# Patient Record
Sex: Female | Born: 1960
Health system: Southern US, Community
[De-identification: ages and names within clinical notes are randomized; demographics above are authoritative.]

## PROBLEM LIST (undated history)

## (undated) DIAGNOSIS — E785 Hyperlipidemia, unspecified: Secondary | ICD-10-CM

## (undated) DIAGNOSIS — M1990 Unspecified osteoarthritis, unspecified site: Secondary | ICD-10-CM

## (undated) DIAGNOSIS — K5909 Other constipation: Secondary | ICD-10-CM

## (undated) DIAGNOSIS — F329 Major depressive disorder, single episode, unspecified: Secondary | ICD-10-CM

## (undated) DIAGNOSIS — K449 Diaphragmatic hernia without obstruction or gangrene: Secondary | ICD-10-CM

## (undated) DIAGNOSIS — N201 Calculus of ureter: Secondary | ICD-10-CM

## (undated) DIAGNOSIS — F32A Depression, unspecified: Secondary | ICD-10-CM

## (undated) DIAGNOSIS — E119 Type 2 diabetes mellitus without complications: Secondary | ICD-10-CM

## (undated) HISTORY — PX: CARPAL TUNNEL RELEASE: SHX101

## (undated) HISTORY — PX: RETINAL DETACHMENT SURGERY: SHX105

## (undated) HISTORY — PX: ANTERIOR FUSION CERVICAL SPINE: SUR626

---

## 1998-06-27 ENCOUNTER — Encounter: Admission: RE | Admit: 1998-06-27 | Discharge: 1998-09-25 | Payer: Self-pay | Admitting: Internal Medicine

## 2003-09-20 ENCOUNTER — Emergency Department (HOSPITAL_COMMUNITY): Admission: EM | Admit: 2003-09-20 | Discharge: 2003-09-21 | Payer: Self-pay | Admitting: Emergency Medicine

## 2010-01-22 ENCOUNTER — Encounter: Payer: Self-pay | Admitting: Internal Medicine

## 2010-05-08 ENCOUNTER — Emergency Department (HOSPITAL_BASED_OUTPATIENT_CLINIC_OR_DEPARTMENT_OTHER)
Admission: EM | Admit: 2010-05-08 | Discharge: 2010-05-08 | Disposition: A | Discharge status: Home / Self Care | Payer: Private Health Insurance - Indemnity | Attending: Emergency Medicine | Admitting: Emergency Medicine

## 2010-05-08 DIAGNOSIS — Z1833 Retained wood fragments: Secondary | ICD-10-CM | POA: Insufficient documentation

## 2010-05-08 DIAGNOSIS — M795 Residual foreign body in soft tissue: Secondary | ICD-10-CM | POA: Insufficient documentation

## 2010-05-08 DIAGNOSIS — E7800 Pure hypercholesterolemia, unspecified: Secondary | ICD-10-CM | POA: Insufficient documentation

## 2010-05-08 DIAGNOSIS — E119 Type 2 diabetes mellitus without complications: Secondary | ICD-10-CM | POA: Insufficient documentation

## 2010-05-08 DIAGNOSIS — I10 Essential (primary) hypertension: Secondary | ICD-10-CM | POA: Insufficient documentation

## 2010-05-08 DIAGNOSIS — Z79899 Other long term (current) drug therapy: Secondary | ICD-10-CM | POA: Insufficient documentation

## 2012-03-12 ENCOUNTER — Ambulatory Visit
Admission: RE | Admit: 2012-03-12 | Discharge: 2012-03-12 | Disposition: A | Discharge status: Home / Self Care | Payer: Private Health Insurance - Indemnity | Source: Ambulatory Visit | Attending: Family Medicine | Admitting: Family Medicine

## 2012-03-12 ENCOUNTER — Other Ambulatory Visit: Payer: Self-pay | Admitting: Family Medicine

## 2012-03-12 DIAGNOSIS — K222 Esophageal obstruction: Secondary | ICD-10-CM

## 2012-03-20 ENCOUNTER — Other Ambulatory Visit: Payer: Self-pay | Admitting: Gastroenterology

## 2012-03-20 DIAGNOSIS — R1310 Dysphagia, unspecified: Secondary | ICD-10-CM

## 2012-03-24 ENCOUNTER — Other Ambulatory Visit: Payer: Private Health Insurance - Indemnity

## 2013-02-18 ENCOUNTER — Emergency Department (HOSPITAL_BASED_OUTPATIENT_CLINIC_OR_DEPARTMENT_OTHER)
Admission: EM | Admit: 2013-02-18 | Discharge: 2013-02-18 | Disposition: A | Discharge status: Other Acute Inpatient Hospital | Payer: BC Managed Care – PPO | Attending: Emergency Medicine | Admitting: Emergency Medicine

## 2013-02-18 ENCOUNTER — Encounter (HOSPITAL_BASED_OUTPATIENT_CLINIC_OR_DEPARTMENT_OTHER): Payer: Self-pay | Admitting: Emergency Medicine

## 2013-02-18 ENCOUNTER — Emergency Department (HOSPITAL_BASED_OUTPATIENT_CLINIC_OR_DEPARTMENT_OTHER): Payer: BC Managed Care – PPO

## 2013-02-18 DIAGNOSIS — R0602 Shortness of breath: Secondary | ICD-10-CM | POA: Insufficient documentation

## 2013-02-18 DIAGNOSIS — J3489 Other specified disorders of nose and nasal sinuses: Secondary | ICD-10-CM | POA: Insufficient documentation

## 2013-02-18 DIAGNOSIS — E785 Hyperlipidemia, unspecified: Secondary | ICD-10-CM | POA: Insufficient documentation

## 2013-02-18 DIAGNOSIS — F411 Generalized anxiety disorder: Secondary | ICD-10-CM | POA: Insufficient documentation

## 2013-02-18 DIAGNOSIS — E1110 Type 2 diabetes mellitus with ketoacidosis without coma: Secondary | ICD-10-CM | POA: Insufficient documentation

## 2013-02-18 DIAGNOSIS — R000 Tachycardia, unspecified: Secondary | ICD-10-CM | POA: Insufficient documentation

## 2013-02-18 DIAGNOSIS — R0789 Other chest pain: Secondary | ICD-10-CM | POA: Insufficient documentation

## 2013-02-18 DIAGNOSIS — Z79899 Other long term (current) drug therapy: Secondary | ICD-10-CM | POA: Insufficient documentation

## 2013-02-18 DIAGNOSIS — M549 Dorsalgia, unspecified: Secondary | ICD-10-CM | POA: Insufficient documentation

## 2013-02-18 LAB — COMPREHENSIVE METABOLIC PANEL
ALK PHOS: 54 U/L (ref 39–117)
ALT: 24 U/L (ref 0–35)
AST: 24 U/L (ref 0–37)
Albumin: 4.5 g/dL (ref 3.5–5.2)
BILIRUBIN TOTAL: 0.2 mg/dL — AB (ref 0.3–1.2)
BUN: 28 mg/dL — AB (ref 6–23)
CHLORIDE: 87 meq/L — AB (ref 96–112)
CO2: 13 meq/L — AB (ref 19–32)
CREATININE: 0.7 mg/dL (ref 0.50–1.10)
Calcium: 11.4 mg/dL — ABNORMAL HIGH (ref 8.4–10.5)
GFR calc non Af Amer: >90 mL/min (ref >90)
Glucose, Bld: 417 mg/dL — ABNORMAL HIGH (ref 70–99)
Potassium: 4 mEq/L (ref 3.7–5.3)
Sodium: 133 mEq/L — ABNORMAL LOW (ref 137–147)
TOTAL PROTEIN: 8.8 g/dL — AB (ref 6.0–8.3)

## 2013-02-18 LAB — CBC WITH DIFFERENTIAL/PLATELET
BASOS ABS: 0 10*3/uL (ref 0.0–0.1)
Band Neutrophils: 0 % (ref 0–10)
Basophils Relative: 0 % (ref 0–1)
Blasts: 0 %
Eosinophils Absolute: 0.1 10*3/uL (ref 0.0–0.7)
Eosinophils Relative: 1 % (ref 0–5)
HCT: 43.9 % (ref 36.0–46.0)
HEMOGLOBIN: 15.6 g/dL — AB (ref 12.0–15.0)
Lymphocytes Relative: 40 % (ref 12–46)
Lymphs Abs: 5.4 10*3/uL — ABNORMAL HIGH (ref 0.7–4.0)
MCH: 31.9 pg (ref 26.0–34.0)
MCHC: 35.5 g/dL (ref 30.0–36.0)
MCV: 89.8 fL (ref 78.0–100.0)
METAMYELOCYTES PCT: 0 %
MONOS PCT: 1 % — AB (ref 3–12)
MYELOCYTES: 1 %
Monocytes Absolute: 0.1 10*3/uL (ref 0.1–1.0)
NEUTROS ABS: 7.9 10*3/uL — AB (ref 1.7–7.7)
NEUTROS PCT: 57 % (ref 43–77)
Platelets: 468 10*3/uL — ABNORMAL HIGH (ref 150–400)
Promyelocytes Absolute: 0 %
RBC: 4.89 MIL/uL (ref 3.87–5.11)
RDW: 13 % (ref 11.5–15.5)
WBC: 13.5 10*3/uL — AB (ref 4.0–10.5)
nRBC: 0 /100 WBC

## 2013-02-18 LAB — POCT I-STAT 3, VENOUS BLOOD GAS (G3P V)
Acid-base deficit: 13 mmol/L — ABNORMAL HIGH (ref 0.0–2.0)
Bicarbonate: 11.7 mEq/L — ABNORMAL LOW (ref 20.0–24.0)
O2 SAT: 80 %
TCO2: 12 mmol/L (ref 0–100)
pCO2, Ven: 25 mmHg — ABNORMAL LOW (ref 45.0–50.0)
pH, Ven: 7.274 (ref 7.250–7.300)
pO2, Ven: 47 mmHg — ABNORMAL HIGH (ref 30.0–45.0)

## 2013-02-18 LAB — GLUCOSE, CAPILLARY
GLUCOSE-CAPILLARY: 415 mg/dL — AB (ref 70–99)
GLUCOSE-CAPILLARY: 274 mg/dL — AB (ref 70–99)
Glucose-Capillary: 248 mg/dL — ABNORMAL HIGH (ref 70–99)

## 2013-02-18 LAB — TROPONIN I

## 2013-02-18 LAB — CG4 I-STAT (LACTIC ACID): Lactic Acid, Venous: 1.01 mmol/L (ref 0.5–2.2)

## 2013-02-18 MED ORDER — KCL IN DEXTROSE-NACL 20-5-0.45 MEQ/L-%-% IV SOLN: Freq: Once | INTRAVENOUS | Status: DC

## 2013-02-18 MED ORDER — POTASSIUM CHLORIDE 10 MEQ/100ML IV SOLN
10.0000 meq | Freq: Once | INTRAVENOUS | Status: AC
  Administered 2013-02-18 (×2): 10 meq via INTRAVENOUS
  Filled 2013-02-18: qty 100

## 2013-02-18 MED ORDER — SODIUM CHLORIDE 0.9 % IV BOLUS (SEPSIS)
1000.0000 mL | Freq: Once | INTRAVENOUS | Status: AC
  Administered 2013-02-18: 1000 mL via INTRAVENOUS

## 2013-02-18 MED ORDER — INSULIN REGULAR HUMAN 100 UNIT/ML IJ SOLN
INTRAMUSCULAR | Status: AC
  Filled 2013-02-18: qty 1

## 2013-02-18 MED ORDER — DEXTROSE-NACL 5-0.45 % IV SOLN
INTRAVENOUS | Status: DC
  Administered 2013-02-18: 17:00:00 via INTRAVENOUS

## 2013-02-18 MED ORDER — LORAZEPAM 2 MG/ML IJ SOLN
1.0000 mg | Freq: Once | INTRAMUSCULAR | Status: AC
  Administered 2013-02-18: 1 mg via INTRAVENOUS
  Filled 2013-02-18: qty 1

## 2013-02-18 MED ORDER — SODIUM CHLORIDE 0.9 % IV SOLN: INTRAVENOUS | Status: DC

## 2013-02-18 MED ORDER — DEXTROSE-NACL 5-0.45 % IV SOLN: INTRAVENOUS | Status: DC

## 2013-02-18 NOTE — ED Notes (Signed)
Pt c/o dizziness and increased BS , pr recently took self off insulin d/t generalized weakness, pt also c/o back pain and CP x 3 days

## 2013-02-18 NOTE — ED Notes (Signed)
Called report to [place], Charity fundraiserN [facility] Going to room 412.

## 2013-02-18 NOTE — ED Provider Notes (Addendum)
CSN: [id number]     Arrival date & time 02/18/13  1417 History   First MD Initiated Contact with Patient 02/18/13 1503     Chief Complaint  Patient presents with  . Dizziness  . Hyperglycemia     (Consider location/radiation/quality/duration/timing/severity/associated sxs/prior Treatment) HPI  This a 53 year old female with a history of insulin-dependent diabetes who presents with multiple complaints. Patient reports 2 day history of shortness of breath, chest pain, and dizziness.  Patient states that she thought she had a virus that she's had some congestion. She denies any fevers or cough. She reports that the chest pain is burning and feels like "indigestion." She states that she is working harder to breathe. She denies any exertional component. She denies any history of blood clot leg swelling. Glucose in triage was noted to be 415. Patient states that she took herself off of insulin in December because "it makes me feel bad." Patient has had blood sugars in the mid 200s. Patient reports "feeling off balance." She denies room spinning dizziness. She states that the symptoms get worse when she goes from lying to sitting. Patient also endorses increased stress and anxiety. She states that she has lost both of her parents and her dog recently.  Past Medical History  Diagnosis Date  . Diabetes mellitus without complication   . Hyperlipemia    History reviewed. No pertinent past surgical history. History reviewed. No pertinent family history. History  Substance Use Topics  . Smoking status: Never Smoker   . Smokeless tobacco: Not on file  . Alcohol Use: No   OB History   Grav Para Term Preterm Abortions TAB SAB Ect Mult Living                 Review of Systems  Constitutional: Negative for fever.  HENT: Positive for congestion.   Respiratory: Positive for chest tightness and shortness of breath. Negative for cough.   Cardiovascular: Positive for chest pain. Negative for leg  swelling.  Gastrointestinal: Negative for nausea, vomiting and abdominal pain.  Genitourinary: Negative for dysuria.  Musculoskeletal: Positive for back pain.  Skin: Negative for wound.  Neurological: Positive for dizziness. Negative for headaches.  Psychiatric/Behavioral: Negative for confusion.  All other systems reviewed and are negative.      Allergies  Review of patient's allergies indicates no known allergies.  Home Medications   Current Outpatient Rx  Name  Route  Sig  Dispense  Refill  . fenofibrate (TRIGLIDE) 50 MG tablet   Oral   Take 50 mg by mouth daily.         . ramipril (ALTACE) 5 MG capsule   Oral   Take 5 mg by mouth daily.         . simvastatin (ZOCOR) 40 MG tablet   Oral   Take 40 mg by mouth daily.          BP 138/78  Pulse 127  Temp(Src) 97.2 F (36.2 C)  Resp 16  Ht 5\' 1"  (1.549 m)  Wt 190 lb (86.183 kg)  BMI 35.92 kg/m2  SpO2 100%  LMP 02/15/2013 Physical Exam  Nursing note and vitals reviewed. Constitutional: She is oriented to person, place, and time. No distress.  Anxious appearing  HENT:  Head: Normocephalic and atraumatic.  Mucous membranes dry  Eyes: Pupils are equal, round, and reactive to light.  Neck: Neck supple.  Cardiovascular: Regular rhythm and normal heart sounds.   Tachycardia  Pulmonary/Chest: Effort normal and breath sounds normal. No  respiratory distress. She has no wheezes. She has no rales.  Abdominal: Soft. Bowel sounds are normal. There is no tenderness.  Musculoskeletal: She exhibits no edema.  Neurological: She is alert and oriented to person, place, and time.  Skin: Skin is warm and dry. No rash noted.  Psychiatric:  Anxious    ED Course  Procedures (including critical care time)  CRITICAL CARE Performed by: Ross MarcusHORTON, Geraline Halberstadt, F   Total critical care time: 30 min  Critical care time was exclusive of separately billable procedures and treating other patients.  Critical care was necessary to  treat or prevent imminent or life-threatening deterioration.  Critical care was time spent personally by me on the following activities: development of treatment plan with patient and/or surrogate as well as nursing, discussions with consultants, evaluation of patient's response to treatment, examination of patient, obtaining history from patient or surrogate, ordering and performing treatments and interventions, ordering and review of laboratory studies, ordering and review of radiographic studies, pulse oximetry and re-evaluation of patient's condition.  Labs Review Labs Reviewed  CBC WITH DIFFERENTIAL - Abnormal; Notable for the following:    WBC 13.5 (*)    Hemoglobin 15.6 (*)    Platelets 468 (*)    Monocytes Relative 1 (*)    Neutro Abs 7.9 (*)    Lymphs Abs 5.4 (*)    All other components within normal limits  COMPREHENSIVE METABOLIC PANEL - Abnormal; Notable for the following:    Sodium 133 (*)    Chloride 87 (*)    CO2 13 (*)    Glucose, Bld 417 (*)    BUN 28 (*)    Calcium 11.4 (*)    Total Protein 8.8 (*)    Total Bilirubin 0.2 (*)    All other components within normal limits  GLUCOSE, CAPILLARY - Abnormal; Notable for the following:    Glucose-Capillary 415 (*)    All other components within normal limits  GLUCOSE, CAPILLARY - Abnormal; Notable for the following:    Glucose-Capillary 248 (*)    All other components within normal limits  TROPONIN I  BLOOD GAS, VENOUS   Imaging Review Dg Chest 2 View  02/18/2013   CLINICAL DATA:  Dyspnea and dizziness with history of diabetes.  EXAM: CHEST  2 VIEW  COMPARISON:  DG STERNUM dated 03/12/2012  FINDINGS: The lungs are adequately inflated. There is no focal infiltrate. The cardiac silhouette is normal in size. The pulmonary vascularity is not engorged. The mediastinum is normal in width. There is no pleural effusion. The observed portions of the bony thorax exhibit no acute abnormalities.  IMPRESSION: No active cardiopulmonary  disease.   Electronically Signed   By: David  [place]   On: 02/18/2013 15:49    EKG Interpretation    Date/Time:  Wednesday February 18 2013 14:36:52 EST Ventricular Rate:  129 PR Interval:  162 QRS Duration: 62 QT Interval:  392 QTC Calculation: 574 R Axis:   73 Text Interpretation:  Sinus tachycardia Otherwise normal ECG no prior for comparison Confirmed by Sherah Lund  MD, Toni AmendOURTNEY ([id number]) on 02/18/2013 3:19:44 PM            MDM   Final diagnoses:  DKA (diabetic ketoacidoses)    Patient presents with multiple complaints including dizziness, increased blood sugar, and shortness of breath. EKG showed sinus tachycardia without ST elevation or ischemia. Chest x-ray and troponin are negative for acute process. Lab work notable for hyperglycemia and an anion gap of 33. Patient is orthostatic. After  2 L of fluid, blood glucose still greater than 250. Patient was started on a glucose stabilizer. She will also be started on glucose-containing fluids with supplemental potassium.  Suspect patient's symptoms are likely secondary to hyperglycemia and hyperglycemia secondary to insulin noncompliance. Will admit for further management.    Shon Baton, MD 02/18/13 9147  Shon Baton, MD 02/18/13 [phone]

## 2014-06-23 ENCOUNTER — Other Ambulatory Visit: Payer: Self-pay | Admitting: Family

## 2014-06-23 DIAGNOSIS — Z1231 Encounter for screening mammogram for malignant neoplasm of breast: Secondary | ICD-10-CM

## 2014-10-07 ENCOUNTER — Encounter (INDEPENDENT_AMBULATORY_CARE_PROVIDER_SITE_OTHER): Payer: Self-pay | Admitting: Ophthalmology

## 2014-11-15 DIAGNOSIS — E119 Type 2 diabetes mellitus without complications: Secondary | ICD-10-CM | POA: Insufficient documentation

## 2014-11-15 DIAGNOSIS — E785 Hyperlipidemia, unspecified: Secondary | ICD-10-CM | POA: Insufficient documentation

## 2014-11-18 ENCOUNTER — Ambulatory Visit (INDEPENDENT_AMBULATORY_CARE_PROVIDER_SITE_OTHER): Payer: BC Managed Care – PPO | Admitting: Cardiovascular Disease

## 2014-11-18 ENCOUNTER — Encounter: Payer: Self-pay | Admitting: Cardiovascular Disease

## 2014-11-18 VITALS — BP 120/66 | HR 88 | Ht 61.0 in | Wt 206.0 lb

## 2014-11-18 DIAGNOSIS — Z8249 Family history of ischemic heart disease and other diseases of the circulatory system: Secondary | ICD-10-CM

## 2014-11-18 DIAGNOSIS — E785 Hyperlipidemia, unspecified: Secondary | ICD-10-CM | POA: Diagnosis not present

## 2014-11-18 NOTE — Progress Notes (Signed)
Cardiology Office Note   Date:  11/18/2014   ID:  Lindsay MaltaCristina Paulsen, DOB 1960/06/18, MRN [id number]  PCP:  Aura DialsBOUSKA,DAVID E, MD  Cardiologist:   Vesta MixerNahser, Philip J, MD   Chief Complaint  Patient presents with  . Coronary Artery Disease   Problem List 1. Diabetes Mellitus, diabetic retinopathy 2.  Hyperlipidemia     History of Present Illness: Lindsay [name] is a 54 y.o. female who presents for  Evaluation for the possibility of coronary artery disease. She has a history of diabetes mellitus for many years. She's fairly insulin resistant. She has a diabetic retinopathy. She also has a history of hyperlipidemia.   She has a strong family history of coronary artery disease.  multiple members of her family have had CABG Does not exercise but is very active at work .  She recently had surgery on her neck  - which limits her activity   Past Medical History  Diagnosis Date  . Diabetes mellitus without complication (HCC)   . Hyperlipemia     No past surgical history on file.   Current Outpatient Prescriptions  Medication Sig Dispense Refill  . B-D ULTRAFINE III SHORT PEN 31G X 8 MM MISC     . Biotin (RA BIOTIN) 2.5 MG CAPS Take 1 tablet by mouth daily.    . canagliflozin (INVOKANA) 300 MG TABS tablet Take 1 tablet by mouth daily.    . cyclobenzaprine (FLEXERIL) 10 MG tablet Take 10 mg by mouth every 8 (eight) hours as needed. As needed for muscle spasms or arthritis pain    . DULoxetine (CYMBALTA) 30 MG capsule Take 30 mg by mouth 2 (two) times daily.    . DUREZOL 0.05 % EMUL Place 1 drop into both eyes 2 (two) times daily.    . fenofibrate (TRIGLIDE) 50 MG tablet Take 50 mg by mouth daily.    Marland Kitchen. gabapentin (NEURONTIN) 100 MG capsule Take 100 mg by mouth 3 (three) times daily.    Marland Kitchen. glucose blood (ONE TOUCH ULTRA TEST) test strip Use four times a day    . Insulin Detemir (LEVEMIR FLEXTOUCH) 100 UNIT/ML Pen Inject 75 Units into the skin 2 (two) times daily.    . Insulin Pen Needle  (B-D UF III MINI PEN NEEDLES) 31G X 5 MM MISC Use 3 times a day    . Misc Natural Products (OSTEO BI-FLEX ADV DOUBLE ST) TABS Take 1 tablet by mouth daily.    Marland Kitchen. NOVOLOG FLEXPEN 100 UNIT/ML FlexPen Inject 15-40 Units as directed 3 (three) times daily. Sliding scale    . oxyCODONE-acetaminophen (PERCOCET) 10-325 MG tablet Take 1 tablet by mouth every 4 (four) hours as needed. As needed for pain    . ramipril (ALTACE) 10 MG capsule Take 10 mg by mouth daily.    . ramipril (ALTACE) 5 MG capsule Take 5 mg by mouth daily.    . simvastatin (ZOCOR) 40 MG tablet Take 40 mg by mouth daily.     No current facility-administered medications for this visit.    Allergies:   Review of patient's allergies indicates no known allergies.    Social History:  The patient  reports that she has never smoked. She does not have any smokeless tobacco history on file. She reports that she does not drink alcohol or use illicit drugs.   Family History:  The patient's family history is not on file.    ROS:  Please see the history of present illness.    Review of Systems:  Constitutional:  denies fever, chills, diaphoresis, appetite change and fatigue.  HEENT: denies photophobia, eye pain, redness, hearing loss, ear pain, congestion, sore throat, rhinorrhea, sneezing, neck pain, neck stiffness and tinnitus.  Respiratory: denies SOB, DOE, cough, chest tightness, and wheezing.  Cardiovascular: denies chest pain, palpitations and leg swelling.  Gastrointestinal: denies nausea, vomiting, abdominal pain, diarrhea, constipation, blood in stool.  Genitourinary: denies dysuria, urgency, frequency, hematuria, flank pain and difficulty urinating.  Musculoskeletal: denies  myalgias, back pain, joint swelling, arthralgias and gait problem.   Skin: denies pallor, rash and wound.  Neurological: denies dizziness, seizures, syncope, weakness, light-headedness, numbness and headaches.   Hematological: denies adenopathy, easy  bruising, personal or family bleeding history.  Psychiatric/ Behavioral: denies suicidal ideation, mood changes, confusion, nervousness, sleep disturbance and agitation.       All other systems are reviewed and negative.    PHYSICAL EXAM: VS:  BP 120/66 mmHg  Pulse 88  Ht  (1.549 m)  Wt 206 lb (93.441 kg)  BMI 38.94 kg/m2 , BMI Body mass index is 38.94 kg/(m^2). GEN: Well nourished, well developed, in no acute distress HEENT: normal Neck: no JVD, carotid bruits, or masses Cardiac: RRR; no murmurs, rubs, or gallops,no edema  Respiratory:  clear to auscultation bilaterally, normal work of breathing GI: soft, nontender, nondistended, + BS MS: no deformity or atrophy Skin: warm and dry, no rash Neuro:  Strength and sensation are intact Psych: normal   EKG:  EKG is ordered today. The ekg ordered today demonstrates  NSR at 88.  No ST or T wave changes.    Recent Labs: No results found for requested labs within last 365 days.    Lipid Panel No results found for: CHOL, TRIG, HDL, CHOLHDL, VLDL, LDLCALC, LDLDIRECT    Wt Readings from Last 3 Encounters:  11/18/14 206 lb (93.441 kg)  02/18/13 190 lb (86.183 kg)      Other studies Reviewed: Additional studies/ records that were reviewed today include: . Review of the above records demonstrates:    ASSESSMENT AND PLAN:  1.  Family hx of CAD Has multiple family members who have had CABG.  Her sister recently had CABG.  She has multiple risk factors  2. Obesity :  She wants to have gastric bypass.  She is hopeful that this would reverse her Diabetes.  I've encouraged her to go ahead with the planning for gastric bypass. She will likely need a a myoview for pre-op assessment prior to her gastric.    Current medicines are reviewed at length with the patient today.  The patient does not have concerns regarding medicines.  The following changes have been made:  no change  Labs/ tests ordered today include:  No  orders of the defined types were placed in this encounter.     Disposition:   FU with me in 1 year      Nahser, Deloris Ping, MD  11/18/2014 3:52 PM    [facility] [address] [place], [place], [place]  [address] Phone: [phone]; Fax: [phone]   [facility]  [address] [place], [place]  [address] [phone]   Fax [phone]

## 2014-11-18 NOTE — Patient Instructions (Signed)

## 2015-02-02 HISTORY — PX: EYE SURGERY: SHX253

## 2015-04-04 ENCOUNTER — Other Ambulatory Visit (HOSPITAL_COMMUNITY): Payer: Self-pay | Admitting: General Surgery

## 2015-04-11 ENCOUNTER — Ambulatory Visit (HOSPITAL_COMMUNITY)
Admission: RE | Admit: 2015-04-11 | Discharge: 2015-04-11 | Disposition: A | Discharge status: Home / Self Care | Payer: BC Managed Care – PPO | Source: Ambulatory Visit | Attending: General Surgery | Admitting: General Surgery

## 2015-04-11 ENCOUNTER — Other Ambulatory Visit: Payer: Self-pay

## 2015-04-11 DIAGNOSIS — K449 Diaphragmatic hernia without obstruction or gangrene: Secondary | ICD-10-CM | POA: Diagnosis not present

## 2015-04-11 DIAGNOSIS — K224 Dyskinesia of esophagus: Secondary | ICD-10-CM | POA: Insufficient documentation

## 2015-04-21 ENCOUNTER — Encounter: Payer: BC Managed Care – PPO | Attending: General Surgery | Admitting: Dietician

## 2015-04-21 ENCOUNTER — Encounter: Payer: Self-pay | Admitting: Dietician

## 2015-04-21 NOTE — Progress Notes (Signed)
  Pre-Op Assessment Visit:  Pre-Operative RYGB Surgery  Medical Nutrition Therapy:  Appt start time: 330   End time:  430.  Patient was seen on 04/21/2015 for Pre-Operative Nutrition Assessment. Assessment and letter of approval [facility] coordinator on 04/22/2015.   Preferred Learning Style:   No preference indicated   Learning Readiness:   Ready  Handouts given during visit include:  Pre-Op Goals Bariatric Surgery Protein Shakes   During the appointment today the following Pre-Op Goals were reviewed with the patient: Maintain or lose weight as instructed by your surgeon Make healthy food choices Begin to limit portion sizes Limited concentrated sugars and fried foods Keep fat/sugar in the single digits per serving on   food labels Practice CHEWING your food  (aim for 30 chews per bite or until applesauce consistency) Practice not drinking 15 minutes before, during, and 30 minutes after each meal/snack Avoid all carbonated beverages  Avoid/limit caffeinated beverages  Avoid all sugar-sweetened beverages Consume 3 meals per day; eat every 3-5 hours Make a list of non-food related activities Aim for 64-100 ounces of FLUID daily  Aim for at least 60-80 grams of PROTEIN daily Look for a liquid protein source that contain ?15 g protein and ?5 g carbohydrate  (ex: shakes, drinks, shots)  Demonstrated degree of understanding via:  Teach Back  Teaching Method Utilized:  Visual Auditory Hands on  Barriers to learning/adherence to lifestyle change: none  Patient to call the [facility] to enroll in Pre-Op and Post-Op Nutrition Education when surgery date is scheduled.

## 2015-05-23 SURGERY — Surgical Case
Anesthesia: *Unknown

## 2015-06-08 ENCOUNTER — Telehealth: Payer: Self-pay | Admitting: Gastroenterology

## 2015-06-08 NOTE — Telephone Encounter (Signed)
Contacted the patient. She is scheduled for an esophageal manometry for [facility].  Instructed the patient to fast for 8 hours prior to the test. [facility]15[facility]. This information was faxed to [facility] 06/07/15

## 2015-06-27 ENCOUNTER — Ambulatory Visit (HOSPITAL_COMMUNITY)
Admission: RE | Admit: 2015-06-27 | Discharge: 2015-06-27 | Disposition: A | Discharge status: Home / Self Care | Payer: BC Managed Care – PPO | Source: Ambulatory Visit | Attending: Gastroenterology | Admitting: Gastroenterology

## 2015-06-27 ENCOUNTER — Encounter (HOSPITAL_COMMUNITY)
Admission: RE | Disposition: A | Discharge status: Home / Self Care | Payer: Self-pay | Source: Ambulatory Visit | Attending: Gastroenterology

## 2015-06-27 DIAGNOSIS — Z01818 Encounter for other preprocedural examination: Secondary | ICD-10-CM | POA: Insufficient documentation

## 2015-06-27 HISTORY — PX: ESOPHAGEAL MANOMETRY: SHX5429

## 2015-06-27 SURGERY — MANOMETRY, ESOPHAGUS
Anesthesia: LOCAL

## 2015-06-27 MED ORDER — LIDOCAINE VISCOUS 2 % MT SOLN
OROMUCOSAL | Status: AC
  Filled 2015-06-27: qty 15

## 2015-06-27 SURGICAL SUPPLY — 2 items
FACESHIELD LNG OPTICON STERILE (SAFETY) IMPLANT
GLOVE BIO SURGEON STRL SZ8 (GLOVE) ×4 IMPLANT

## 2015-06-27 NOTE — Progress Notes (Signed)
Esophageal Manometry done per protocol. Pt tolerated well with no complications. Report to be sent to Dr. Izell CarolinaNandigam's ofice.

## 2015-06-28 ENCOUNTER — Encounter (HOSPITAL_COMMUNITY): Payer: Self-pay | Admitting: Gastroenterology

## 2015-07-01 DIAGNOSIS — Z01818 Encounter for other preprocedural examination: Secondary | ICD-10-CM | POA: Insufficient documentation

## 2015-07-14 ENCOUNTER — Telehealth: Payer: Self-pay | Admitting: Nurse Practitioner

## 2015-07-14 DIAGNOSIS — Z8249 Family history of ischemic heart disease and other diseases of the circulatory system: Secondary | ICD-10-CM

## 2015-07-14 DIAGNOSIS — Z01810 Encounter for preprocedural cardiovascular examination: Secondary | ICD-10-CM

## 2015-07-14 NOTE — Telephone Encounter (Signed)
Spoke with patient and advised her of Dr. Harvie BridgeNahser's recommendation to order a 2 day lexiscan myoview for pre-surgical evaluation.  She verbalized understanding and agreement and is aware someone will call her to schedule.

## 2015-07-14 NOTE — Telephone Encounter (Signed)
F/u  Pt returning RN phone call. Please call back and discuss.   

## 2015-07-14 NOTE — Telephone Encounter (Signed)
Left message for patient to call office regarding note from Dr. Elease HashimotoNahser:   We will get this arranged.  As you have noted, she has been evaluated already by cardiology .  Will get a 2 day Lexiscan myoview  I will be happy to see her again if needed but we should be able to provide pre-operative risk assessment with the previous office note and the myoview   Thanks   [employer] Group[employer] Nahser

## 2015-07-18 ENCOUNTER — Ambulatory Visit (HOSPITAL_COMMUNITY): Payer: BC Managed Care – PPO | Attending: Cardiology

## 2015-07-18 DIAGNOSIS — Z87891 Personal history of nicotine dependence: Secondary | ICD-10-CM | POA: Insufficient documentation

## 2015-07-18 DIAGNOSIS — Z01810 Encounter for preprocedural cardiovascular examination: Secondary | ICD-10-CM

## 2015-07-18 DIAGNOSIS — R9439 Abnormal result of other cardiovascular function study: Secondary | ICD-10-CM | POA: Diagnosis not present

## 2015-07-18 DIAGNOSIS — E119 Type 2 diabetes mellitus without complications: Secondary | ICD-10-CM | POA: Diagnosis not present

## 2015-07-18 DIAGNOSIS — E669 Obesity, unspecified: Secondary | ICD-10-CM | POA: Insufficient documentation

## 2015-07-18 DIAGNOSIS — Z8249 Family history of ischemic heart disease and other diseases of the circulatory system: Secondary | ICD-10-CM

## 2015-07-18 HISTORY — PX: CARDIOVASCULAR STRESS TEST: SHX262

## 2015-07-18 MED ORDER — TECHNETIUM TC 99M TETROFOSMIN IV KIT
33.0000 | PACK | Freq: Once | INTRAVENOUS | Status: AC | PRN
  Administered 2015-07-18: 33 via INTRAVENOUS
  Filled 2015-07-18: qty 33

## 2015-07-18 MED ORDER — REGADENOSON 0.4 MG/5ML IV SOLN
0.4000 mg | Freq: Once | INTRAVENOUS | Status: AC
  Administered 2015-07-18: 0.4 mg via INTRAVENOUS

## 2015-07-19 ENCOUNTER — Ambulatory Visit (HOSPITAL_COMMUNITY): Payer: BC Managed Care – PPO | Attending: Internal Medicine

## 2015-07-19 LAB — MYOCARDIAL PERFUSION IMAGING
CHL CUP NUCLEAR SRS: 1
CSEPPHR: 105 {beats}/min
LV dias vol: 108 mL (ref 46–106)
LVSYSVOL: 39 mL
NUC STRESS TID: 0.97
RATE: 0.35
Rest HR: 86 {beats}/min
SDS: 2
SSS: 3

## 2015-07-19 MED ORDER — TECHNETIUM TC 99M TETROFOSMIN IV KIT
33.0000 | PACK | Freq: Once | INTRAVENOUS | Status: AC | PRN
  Administered 2015-07-19: 33 via INTRAVENOUS
  Filled 2015-07-19: qty 33

## 2015-07-21 ENCOUNTER — Telehealth: Payer: Self-pay | Admitting: Cardiovascular Disease

## 2015-07-21 NOTE — Telephone Encounter (Signed)
Left message for patient to call back for results.  

## 2015-07-21 NOTE — Telephone Encounter (Signed)
Reviewed myoview results with patient and advised her that surgical clearance will be sent to Dr. Sheliah HatchKinsinger and his surgical coordinator.  She verbalized understanding of results and thanked me for the call.

## 2015-07-21 NOTE — Telephone Encounter (Signed)
New message ° ° ° ° ° °Returning a call to the nurse to get test results °

## 2015-07-25 ENCOUNTER — Encounter: Payer: BC Managed Care – PPO | Attending: General Surgery

## 2015-07-25 DIAGNOSIS — Z713 Dietary counseling and surveillance: Secondary | ICD-10-CM | POA: Insufficient documentation

## 2015-07-25 NOTE — Progress Notes (Signed)
  Pre-Operative Nutrition Class:  Appt start time: 4098   End time:  1830.  Patient was seen on 07/26/2015 for Pre-Operative Bariatric Surgery Education at the [facility].   Surgery date:  Surgery type: RYGB Start weight at [facility]: 213.5 lbs Weight today: 220.2 lbs  TANITA  BODY COMP RESULTS  07/25/15   BMI (kg/m^2) 41.6   Fat Mass (lbs) 106.6   Fat Free Mass (lbs) 113.6   Total Body Water (lbs) 82.2   Samples given per MNT protocol. Patient educated on appropriate usage: Bariatric Advantage Calcium Citrate (caramel - qty 1) Lot #: [id number] Exp: 11/2015  Premier protein shake (strawberry - qty 1) Lot #: [id number] Exp: 05/2016  Unjury protein powder (vanilla - qty 1) Lot #: [id number] Exp: 09/2015  The following the learning objectives were met by the patient during this course:  Identify Pre-Op Dietary Goals and will begin 2 weeks pre-operatively  Identify appropriate sources of fluids and proteins   State protein recommendations and appropriate sources pre and post-operatively  Identify Post-Operative Dietary Goals and will follow for 2 weeks post-operatively  Identify appropriate multivitamin and calcium sources  Describe the need for physical activity post-operatively and will follow MD recommendations  State when to call healthcare provider regarding medication questions or post-operative complications  Handouts given during class include:  Pre-Op Bariatric Surgery Diet Handout  Protein Shake Handout  Post-Op Bariatric Surgery Nutrition Handout  [facility] Program Information Flyer  Support Group Information Flyer  [facility] Bariatric Supplements Price List  Follow-Up Plan: Patient will follow-up at [facility] 2 weeks post operatively for diet advancement per MD.

## 2015-08-01 NOTE — Progress Notes (Signed)
Please place orders in EPIC as patient has a pre-op appointment with the nurse at [facility] on Thursday 08/04/2015 at 1030 am! Thank you!

## 2015-08-02 ENCOUNTER — Ambulatory Visit: Payer: Self-pay | Admitting: General Surgery

## 2015-08-04 ENCOUNTER — Encounter (HOSPITAL_COMMUNITY): Payer: Self-pay | Admitting: *Deleted

## 2015-08-04 ENCOUNTER — Encounter (HOSPITAL_COMMUNITY)
Admission: RE | Admit: 2015-08-04 | Discharge: 2015-08-04 | Disposition: A | Discharge status: Home / Self Care | Payer: BC Managed Care – PPO | Source: Ambulatory Visit | Attending: General Surgery | Admitting: General Surgery

## 2015-08-04 DIAGNOSIS — E669 Obesity, unspecified: Secondary | ICD-10-CM | POA: Diagnosis not present

## 2015-08-04 DIAGNOSIS — Z6841 Body Mass Index (BMI) 40.0 and over, adult: Secondary | ICD-10-CM | POA: Insufficient documentation

## 2015-08-04 DIAGNOSIS — Z01812 Encounter for preprocedural laboratory examination: Secondary | ICD-10-CM | POA: Diagnosis present

## 2015-08-04 HISTORY — DX: Depression, unspecified: F32.A

## 2015-08-04 HISTORY — DX: Major depressive disorder, single episode, unspecified: F32.9

## 2015-08-04 HISTORY — DX: Unspecified osteoarthritis, unspecified site: M19.90

## 2015-08-04 LAB — CBC WITH DIFFERENTIAL/PLATELET
Basophils Absolute: 0 10*3/uL (ref 0.0–0.1)
Basophils Relative: 0 %
Eosinophils Absolute: 0.2 10*3/uL (ref 0.0–0.7)
Eosinophils Relative: 2 %
HCT: 42.3 % (ref 36.0–46.0)
HEMOGLOBIN: 14.3 g/dL (ref 12.0–15.0)
LYMPHS ABS: 4.4 10*3/uL — AB (ref 0.7–4.0)
LYMPHS PCT: 38 %
MCH: 30.2 pg (ref 26.0–34.0)
MCHC: 33.8 g/dL (ref 30.0–36.0)
MCV: 89.2 fL (ref 78.0–100.0)
Monocytes Absolute: 0.7 10*3/uL (ref 0.1–1.0)
Monocytes Relative: 6 %
NEUTROS PCT: 54 %
Neutro Abs: 6.2 10*3/uL (ref 1.7–7.7)
Platelets: 364 10*3/uL (ref 150–400)
RBC: 4.74 MIL/uL (ref 3.87–5.11)
RDW: 13.5 % (ref 11.5–15.5)
WBC: 11.6 10*3/uL — AB (ref 4.0–10.5)

## 2015-08-04 LAB — COMPREHENSIVE METABOLIC PANEL
ALK PHOS: 39 U/L (ref 38–126)
ALT: 29 U/L (ref 14–54)
AST: 33 U/L (ref 15–41)
Albumin: 4.8 g/dL (ref 3.5–5.0)
Anion gap: 10 (ref 5–15)
BUN: 35 mg/dL — ABNORMAL HIGH (ref 6–20)
CALCIUM: 10.5 mg/dL — AB (ref 8.9–10.3)
CO2: 24 mmol/L (ref 22–32)
CREATININE: 0.67 mg/dL (ref 0.44–1.00)
Chloride: 103 mmol/L (ref 101–111)
Glucose, Bld: 104 mg/dL — ABNORMAL HIGH (ref 65–99)
Potassium: 4.4 mmol/L (ref 3.5–5.1)
Sodium: 137 mmol/L (ref 135–145)
Total Bilirubin: 0.7 mg/dL (ref 0.3–1.2)
Total Protein: 8.3 g/dL — ABNORMAL HIGH (ref 6.5–8.1)

## 2015-08-04 NOTE — Progress Notes (Signed)
08-04-15 1700 note CMP results-BUN.

## 2015-08-04 NOTE — Patient Instructions (Signed)
Lawan Lingo  08/04/2015   Your procedure is scheduled on: 08-15-15   Report to [facility] Main  Entrance take [facility]  elevators to 3rd floor to  Short Stay Center at  0900 AM.  Call this number if you have problems the morning of surgery [phone]   Remember: ONLY 1 PERSON MAY GO WITH YOU TO SHORT STAY TO GET  READY MORNING OF YOUR SURGERY.  Do not eat food or drink liquids :After Midnight.     Take these medicines the morning of surgery with A SIP OF WATER: Duloxetine. Fenofibrate. Levemir (1/2 usual PM dose) night before. None AM of- DO NOT TAKE ANY DIABETIC MEDICATIONS DAY OF YOUR SURGERY                               You may not have any metal on your body including hair pins and              piercings  Do not wear jewelry, make-up, lotions, powders or perfumes, deodorant             Do not wear nail polish.  Do not shave  48 hours prior to surgery.              Men may shave face and neck.   Do not bring valuables to the hospital. [facility] IS NOT             RESPONSIBLE   FOR VALUABLES.  Contacts, dentures or bridgework may not be worn into surgery.  Leave suitcase in the car. After surgery it may be brought to your room.     Patients discharged the day of surgery will not be allowed to drive home.  Name and phone number of your driver:Vito - [phone] cell  Special Instructions: N/A              Please read over the following fact sheets you were given: _____________________________________________________________________             [facility] - Preparing for Surgery Before surgery, you can play an important role.  Because skin is not sterile, your skin needs to be as free of germs as possible.  You can reduce the number of germs on your skin by washing with CHG (chlorahexidine gluconate) soap before surgery.  CHG is an antiseptic cleaner which kills germs and bonds with the skin to continue killing germs even after washing. Please DO NOT  use if you have an allergy to CHG or antibacterial soaps.  If your skin becomes reddened/irritated stop using the CHG and inform your nurse when you arrive at Short Stay. Do not shave (including legs and underarms) for at least 48 hours prior to the first CHG shower.  You may shave your face/neck. Please follow these instructions carefully:  1.  Shower with CHG Soap the night before surgery and the  morning of Surgery.  2.  If you choose to wash your hair, wash your hair first as usual with your  normal  shampoo.  3.  After you shampoo, rinse your hair and body thoroughly to remove the  shampoo.                           4.  Use CHG as you would any other liquid  soap.  You can apply chg directly  to the skin and wash                       Gently with a scrungie or clean washcloth.  5.  Apply the CHG Soap to your body ONLY FROM THE NECK DOWN.   Do not use on face/ open                           Wound or open sores. Avoid contact with eyes, ears mouth and genitals (private parts).                       Wash face,  Genitals (private parts) with your normal soap.             6.  Wash thoroughly, paying special attention to the area where your surgery  will be performed.  7.  Thoroughly rinse your body with warm water from the neck down.  8.  DO NOT shower/wash with your normal soap after using and rinsing off  the CHG Soap.                9.  Pat yourself dry with a clean towel.            10.  Wear clean pajamas.            11.  Place clean sheets on your bed the night of your first shower and do not  sleep with pets. Day of Surgery : Do not apply any lotions/deodorants the morning of surgery.  Please wear clean clothes to the hospital/surgery center.  FAILURE TO FOLLOW THESE INSTRUCTIONS MAY RESULT IN THE CANCELLATION OF YOUR SURGERY PATIENT SIGNATURE_________________________________  NURSE  SIGNATURE__________________________________  ________________________________________________________________________

## 2015-08-04 NOTE — Pre-Procedure Instructions (Addendum)
EKG, CXR 4'17, Stress7 '17 Epic.

## 2015-08-08 NOTE — Pre-Procedure Instructions (Signed)
08-08-15 1110 - A!C level 06-23-15 =7.9 with Dr. Willeen CassBalan's office. Report faxed.

## 2015-08-12 MED FILL — oxyCODONE HCL 5 MG/5ML SOLN: 5 | 3 days supply | Qty: 200 | Fill #0

## 2015-08-15 ENCOUNTER — Inpatient Hospital Stay (HOSPITAL_COMMUNITY)
Admission: RE | Admit: 2015-08-15 | Discharge: 2015-08-17 | DRG: 621 | Disposition: A | Discharge status: Home / Self Care | Payer: BC Managed Care – PPO | Source: Ambulatory Visit | Attending: General Surgery | Admitting: General Surgery

## 2015-08-15 ENCOUNTER — Inpatient Hospital Stay (HOSPITAL_COMMUNITY): Payer: BC Managed Care – PPO | Admitting: Anesthesiology

## 2015-08-15 ENCOUNTER — Encounter (HOSPITAL_COMMUNITY): Payer: Self-pay | Admitting: *Deleted

## 2015-08-15 ENCOUNTER — Encounter (HOSPITAL_COMMUNITY)
Admission: RE | Disposition: A | Discharge status: Home / Self Care | Payer: Self-pay | Source: Ambulatory Visit | Attending: General Surgery

## 2015-08-15 DIAGNOSIS — K449 Diaphragmatic hernia without obstruction or gangrene: Secondary | ICD-10-CM | POA: Diagnosis present

## 2015-08-15 DIAGNOSIS — E785 Hyperlipidemia, unspecified: Secondary | ICD-10-CM | POA: Diagnosis present

## 2015-08-15 DIAGNOSIS — Z794 Long term (current) use of insulin: Secondary | ICD-10-CM

## 2015-08-15 DIAGNOSIS — Z6839 Body mass index (BMI) 39.0-39.9, adult: Secondary | ICD-10-CM | POA: Diagnosis not present

## 2015-08-15 DIAGNOSIS — I10 Essential (primary) hypertension: Secondary | ICD-10-CM | POA: Diagnosis present

## 2015-08-15 DIAGNOSIS — K228 Other specified diseases of esophagus: Secondary | ICD-10-CM | POA: Diagnosis present

## 2015-08-15 DIAGNOSIS — K219 Gastro-esophageal reflux disease without esophagitis: Secondary | ICD-10-CM | POA: Diagnosis present

## 2015-08-15 DIAGNOSIS — Z87891 Personal history of nicotine dependence: Secondary | ICD-10-CM

## 2015-08-15 DIAGNOSIS — Z79899 Other long term (current) drug therapy: Secondary | ICD-10-CM | POA: Diagnosis not present

## 2015-08-15 DIAGNOSIS — E11319 Type 2 diabetes mellitus with unspecified diabetic retinopathy without macular edema: Secondary | ICD-10-CM | POA: Diagnosis present

## 2015-08-15 HISTORY — PX: LAPAROSCOPIC ROUX-EN-Y GASTRIC BYPASS WITH HIATAL HERNIA REPAIR: SHX6513

## 2015-08-15 LAB — HEMOGLOBIN AND HEMATOCRIT, BLOOD
HCT: 35.2 % — ABNORMAL LOW (ref 36.0–46.0)
HEMOGLOBIN: 11.8 g/dL — AB (ref 12.0–15.0)

## 2015-08-15 LAB — CBC
HEMATOCRIT: 34.4 % — AB (ref 36.0–46.0)
HEMOGLOBIN: 11.6 g/dL — AB (ref 12.0–15.0)
MCH: 29.9 pg (ref 26.0–34.0)
MCHC: 33.7 g/dL (ref 30.0–36.0)
MCV: 88.7 fL (ref 78.0–100.0)
Platelets: 310 10*3/uL (ref 150–400)
RBC: 3.88 MIL/uL (ref 3.87–5.11)
RDW: 13.4 % (ref 11.5–15.5)
WBC: 17.1 10*3/uL — ABNORMAL HIGH (ref 4.0–10.5)

## 2015-08-15 LAB — CREATININE, SERUM
Creatinine, Ser: 0.7 mg/dL (ref 0.44–1.00)
GFR calc Af Amer: >60 mL/min (ref >60)
GFR calc non Af Amer: >60 mL/min (ref >60)

## 2015-08-15 LAB — GLUCOSE, CAPILLARY
GLUCOSE-CAPILLARY: 153 mg/dL — AB (ref 65–99)
GLUCOSE-CAPILLARY: 218 mg/dL — AB (ref 65–99)
GLUCOSE-CAPILLARY: 229 mg/dL — AB (ref 65–99)
Glucose-Capillary: 171 mg/dL — ABNORMAL HIGH (ref 65–99)
Glucose-Capillary: 244 mg/dL — ABNORMAL HIGH (ref 65–99)

## 2015-08-15 LAB — PREGNANCY, URINE: PREG TEST UR: NEGATIVE

## 2015-08-15 SURGERY — CREATION, GASTRIC BYPASS, LAPAROSCOPIC, USING ROUX-EN-Y GASTROENTEROSTOMY, WITH HIATAL HERNIA REPAIR
Anesthesia: General

## 2015-08-15 MED ORDER — MORPHINE SULFATE (PF) 10 MG/ML IV SOLN
2.0000 mg | INTRAVENOUS | Status: DC | PRN
  Administered 2015-08-15: 2 mg via INTRAVENOUS
  Filled 2015-08-15: qty 1

## 2015-08-15 MED ORDER — 0.9 % SODIUM CHLORIDE (POUR BTL) OPTIME
TOPICAL | Status: DC | PRN
  Administered 2015-08-15: 1000 mL

## 2015-08-15 MED ORDER — BUPIVACAINE-EPINEPHRINE 0.25% -1:200000 IJ SOLN
INTRAMUSCULAR | Status: DC | PRN
  Administered 2015-08-15: 50 mL

## 2015-08-15 MED ORDER — LIDOCAINE HCL (CARDIAC) 20 MG/ML IV SOLN
INTRAVENOUS | Status: DC | PRN
  Administered 2015-08-15: 100 mg via INTRAVENOUS

## 2015-08-15 MED ORDER — DULOXETINE HCL 30 MG PO CPEP
30.0000 mg | ORAL_CAPSULE | Freq: Two times a day (BID) | ORAL | Status: DC
  Administered 2015-08-16: 30 mg via ORAL
  Filled 2015-08-15 (×3): qty 1

## 2015-08-15 MED ORDER — LABETALOL HCL 5 MG/ML IV SOLN
INTRAVENOUS | Status: DC | PRN
  Administered 2015-08-15 (×2): 5 mg via INTRAVENOUS
  Administered 2015-08-15: 10 mg via INTRAVENOUS

## 2015-08-15 MED ORDER — ROCURONIUM BROMIDE 100 MG/10ML IV SOLN
INTRAVENOUS | Status: DC | PRN
  Administered 2015-08-15: 10 mg via INTRAVENOUS
  Administered 2015-08-15: 20 mg via INTRAVENOUS
  Administered 2015-08-15: 50 mg via INTRAVENOUS

## 2015-08-15 MED ORDER — GABAPENTIN 100 MG PO CAPS
100.0000 mg | ORAL_CAPSULE | Freq: Three times a day (TID) | ORAL | Status: DC
  Administered 2015-08-16 (×2): 100 mg via ORAL
  Filled 2015-08-15 (×3): qty 1

## 2015-08-15 MED ORDER — INSULIN ASPART 100 UNIT/ML ~~LOC~~ SOLN
SUBCUTANEOUS | Status: AC
  Filled 2015-08-15: qty 1

## 2015-08-15 MED ORDER — OXYCODONE HCL 5 MG/5ML PO SOLN
5.0000 mg | ORAL | Status: DC | PRN
  Administered 2015-08-16 – 2015-08-17 (×2): 10 mg via ORAL
  Filled 2015-08-15 (×2): qty 10

## 2015-08-15 MED ORDER — CHLORHEXIDINE GLUCONATE CLOTH 2 % EX PADS: 6.0000 | MEDICATED_PAD | Freq: Once | CUTANEOUS | Status: DC

## 2015-08-15 MED ORDER — MIDAZOLAM HCL 2 MG/2ML IJ SOLN
INTRAMUSCULAR | Status: AC
  Filled 2015-08-15: qty 2

## 2015-08-15 MED ORDER — HYDROMORPHONE HCL 1 MG/ML IJ SOLN
INTRAMUSCULAR | Status: DC | PRN
  Administered 2015-08-15: 1 mg via INTRAVENOUS

## 2015-08-15 MED ORDER — LACTATED RINGERS IR SOLN
Status: DC | PRN
  Administered 2015-08-15: 1000 mL

## 2015-08-15 MED ORDER — HYDROMORPHONE HCL 2 MG/ML IJ SOLN
INTRAMUSCULAR | Status: AC
  Filled 2015-08-15: qty 1

## 2015-08-15 MED ORDER — LIDOCAINE HCL (CARDIAC) 20 MG/ML IV SOLN
INTRAVENOUS | Status: AC
  Filled 2015-08-15: qty 5

## 2015-08-15 MED ORDER — PREMIER PROTEIN SHAKE
2.0000 [oz_av] | ORAL | Status: DC
  Administered 2015-08-17 (×2): 2 [oz_av] via ORAL

## 2015-08-15 MED ORDER — LABETALOL HCL 5 MG/ML IV SOLN
INTRAVENOUS | Status: AC
  Filled 2015-08-15: qty 4

## 2015-08-15 MED ORDER — ACETAMINOPHEN 10 MG/ML IV SOLN
1000.0000 mg | Freq: Four times a day (QID) | INTRAVENOUS | Status: AC
  Administered 2015-08-15 – 2015-08-16 (×4): 1000 mg via INTRAVENOUS
  Filled 2015-08-15 (×5): qty 100

## 2015-08-15 MED ORDER — LACTATED RINGERS IV SOLN
INTRAVENOUS | Status: DC
  Administered 2015-08-15 (×4): via INTRAVENOUS

## 2015-08-15 MED ORDER — OXYCODONE HCL 5 MG/5ML PO SOLN
5.0000 mg | Freq: Once | ORAL | Status: DC | PRN
  Filled 2015-08-15: qty 5

## 2015-08-15 MED ORDER — ONDANSETRON HCL 4 MG/2ML IJ SOLN: 4.0000 mg | Freq: Once | INTRAMUSCULAR | Status: DC | PRN

## 2015-08-15 MED ORDER — ONDANSETRON HCL 4 MG/2ML IJ SOLN
INTRAMUSCULAR | Status: AC
  Filled 2015-08-15: qty 2

## 2015-08-15 MED ORDER — PROPOFOL 10 MG/ML IV BOLUS
INTRAVENOUS | Status: DC | PRN
  Administered 2015-08-15 (×2): 100 mg via INTRAVENOUS
  Administered 2015-08-15: 200 mg via INTRAVENOUS

## 2015-08-15 MED ORDER — OXYCODONE HCL 5 MG PO TABS: 5.0000 mg | ORAL_TABLET | Freq: Once | ORAL | Status: DC | PRN

## 2015-08-15 MED ORDER — PHENYLEPHRINE 40 MCG/ML (10ML) SYRINGE FOR IV PUSH (FOR BLOOD PRESSURE SUPPORT)
PREFILLED_SYRINGE | INTRAVENOUS | Status: AC
  Filled 2015-08-15: qty 10

## 2015-08-15 MED ORDER — MIDAZOLAM HCL 5 MG/5ML IJ SOLN
INTRAMUSCULAR | Status: DC | PRN
  Administered 2015-08-15: 2 mg via INTRAVENOUS

## 2015-08-15 MED ORDER — HEPARIN SODIUM (PORCINE) 5000 UNIT/ML IJ SOLN
5000.0000 [IU] | Freq: Three times a day (TID) | INTRAMUSCULAR | Status: DC
  Administered 2015-08-15 – 2015-08-17 (×5): 5000 [IU] via SUBCUTANEOUS
  Filled 2015-08-15 (×5): qty 1

## 2015-08-15 MED ORDER — HEPARIN SODIUM (PORCINE) 5000 UNIT/ML IJ SOLN
5000.0000 [IU] | INTRAMUSCULAR | Status: AC
  Administered 2015-08-15: 5000 [IU] via SUBCUTANEOUS
  Filled 2015-08-15: qty 1

## 2015-08-15 MED ORDER — ACETAMINOPHEN 160 MG/5ML PO SOLN: 325.0000 mg | ORAL | Status: DC | PRN

## 2015-08-15 MED ORDER — ONDANSETRON HCL 4 MG/2ML IJ SOLN: 4.0000 mg | INTRAMUSCULAR | Status: DC | PRN

## 2015-08-15 MED ORDER — INSULIN ASPART 100 UNIT/ML ~~LOC~~ SOLN
0.0000 [IU] | SUBCUTANEOUS | Status: DC
  Administered 2015-08-15 – 2015-08-16 (×3): 5 [IU] via SUBCUTANEOUS
  Administered 2015-08-16: 3 [IU] via SUBCUTANEOUS
  Administered 2015-08-16 (×4): 5 [IU] via SUBCUTANEOUS
  Administered 2015-08-16: 3 [IU] via SUBCUTANEOUS
  Administered 2015-08-17: 5 [IU] via SUBCUTANEOUS
  Administered 2015-08-17: 2 [IU] via SUBCUTANEOUS

## 2015-08-15 MED ORDER — OXYCODONE HCL 5 MG/5ML PO SOLN
5.0000 mg | Freq: Once | ORAL | Status: DC | PRN
Start: 2015-08-15 — End: 2015-08-15
  Filled 2015-08-15: qty 5

## 2015-08-15 MED ORDER — CEFOTETAN DISODIUM-DEXTROSE 2-2.08 GM-% IV SOLR
INTRAVENOUS | Status: AC
  Filled 2015-08-15: qty 50

## 2015-08-15 MED ORDER — FENTANYL CITRATE (PF) 100 MCG/2ML IJ SOLN: 25.0000 ug | INTRAMUSCULAR | Status: DC | PRN

## 2015-08-15 MED ORDER — CEFOTETAN DISODIUM-DEXTROSE 2-2.08 GM-% IV SOLR
2.0000 g | INTRAVENOUS | Status: AC
  Administered 2015-08-15: 2 g via INTRAVENOUS

## 2015-08-15 MED ORDER — FENTANYL CITRATE (PF) 100 MCG/2ML IJ SOLN
INTRAMUSCULAR | Status: DC | PRN
Start: 2015-08-15 — End: 2015-08-15
  Administered 2015-08-15 (×5): 50 ug via INTRAVENOUS
  Administered 2015-08-15 (×2): 100 ug via INTRAVENOUS
  Administered 2015-08-15: 50 ug via INTRAVENOUS

## 2015-08-15 MED ORDER — PROPOFOL 10 MG/ML IV BOLUS
INTRAVENOUS | Status: AC
  Filled 2015-08-15: qty 20

## 2015-08-15 MED ORDER — FENTANYL CITRATE (PF) 250 MCG/5ML IJ SOLN
INTRAMUSCULAR | Status: AC
  Filled 2015-08-15: qty 5

## 2015-08-15 MED ORDER — INSULIN ASPART 100 UNIT/ML ~~LOC~~ SOLN
5.0000 [IU] | Freq: Once | SUBCUTANEOUS | Status: AC
  Administered 2015-08-15: 5 [IU] via SUBCUTANEOUS

## 2015-08-15 MED ORDER — DEXTROSE-NACL 5-0.45 % IV SOLN
INTRAVENOUS | Status: DC
  Administered 2015-08-15: 18:00:00 via INTRAVENOUS
  Administered 2015-08-16: 1000 mL via INTRAVENOUS

## 2015-08-15 MED ORDER — GLYCOPYRROLATE 0.2 MG/ML IJ SOLN
INTRAMUSCULAR | Status: DC | PRN
  Administered 2015-08-15: .8 mg via INTRAVENOUS

## 2015-08-15 MED ORDER — ACETAMINOPHEN 160 MG/5ML PO SOLN: 650.0000 mg | ORAL | Status: DC | PRN

## 2015-08-15 MED ORDER — BUPIVACAINE-EPINEPHRINE 0.25% -1:200000 IJ SOLN
INTRAMUSCULAR | Status: AC
  Filled 2015-08-15: qty 1

## 2015-08-15 MED ORDER — LIDOCAINE HCL (CARDIAC) 20 MG/ML IV SOLN
INTRAVENOUS | Status: AC
  Filled 2015-08-15: qty 10

## 2015-08-15 MED ORDER — NEOSTIGMINE METHYLSULFATE 10 MG/10ML IV SOLN
INTRAVENOUS | Status: DC | PRN
  Administered 2015-08-15: 5 mg via INTRAVENOUS

## 2015-08-15 MED ORDER — FAMOTIDINE IN NACL 20-0.9 MG/50ML-% IV SOLN
20.0000 mg | Freq: Two times a day (BID) | INTRAVENOUS | Status: DC
  Administered 2015-08-15 – 2015-08-16 (×3): 20 mg via INTRAVENOUS
  Filled 2015-08-15 (×5): qty 50

## 2015-08-15 MED ORDER — MORPHINE SULFATE (PF) 2 MG/ML IV SOLN
2.0000 mg | INTRAVENOUS | Status: DC | PRN
  Administered 2015-08-16 (×3): 2 mg via INTRAVENOUS
  Filled 2015-08-15 (×3): qty 1

## 2015-08-15 SURGICAL SUPPLY — 60 items
APPLIER CLIP 5 13 M/L LIGAMAX5 (MISCELLANEOUS)
APPLIER CLIP ROT 10 11.4 M/L (STAPLE)
BLADE SURG SZ11 CARB STEEL (BLADE) ×3 IMPLANT
CABLE HIGH FREQUENCY MONO STRZ (ELECTRODE) ×3 IMPLANT
CHLORAPREP W/TINT 26ML (MISCELLANEOUS) ×3 IMPLANT
CLIP APPLIE 5 13 M/L LIGAMAX5 (MISCELLANEOUS) IMPLANT
CLIP APPLIE ROT 10 11.4 M/L (STAPLE) IMPLANT
COVER SURGICAL LIGHT HANDLE (MISCELLANEOUS) ×3 IMPLANT
DECANTER SPIKE VIAL GLASS SM (MISCELLANEOUS) ×3 IMPLANT
DEVICE PMI PUNCTURE CLOSURE (MISCELLANEOUS) ×3 IMPLANT
DEVICE SUTURE ENDOST 10MM (ENDOMECHANICALS) ×3 IMPLANT
DRAIN CHANNEL 19F RND (DRAIN) IMPLANT
DRAIN PENROSE 18X1/4 LTX STRL (WOUND CARE) ×3 IMPLANT
ELECT L-HOOK LAP 45CM DISP (ELECTROSURGICAL) ×3
ELECT PENCIL ROCKER SW 15FT (MISCELLANEOUS) ×3 IMPLANT
ELECT REM PT RETURN 9FT ADLT (ELECTROSURGICAL) ×3
ELECTRODE L-HOOK LAP 45CM DISP (ELECTROSURGICAL) ×1 IMPLANT
ELECTRODE REM PT RTRN 9FT ADLT (ELECTROSURGICAL) ×1 IMPLANT
EVACUATOR SILICONE 100CC (DRAIN) IMPLANT
GAUZE SPONGE 4X4 16PLY XRAY LF (GAUZE/BANDAGES/DRESSINGS) ×3 IMPLANT
GLOVE BIO SURGEON STRL SZ7 (GLOVE) ×3 IMPLANT
GLOVE BIOGEL PI IND STRL 7.0 (GLOVE) ×1 IMPLANT
GLOVE BIOGEL PI INDICATOR 7.0 (GLOVE) ×2
GOWN STRL REUS W/ TWL LRG LVL3 (GOWN DISPOSABLE) IMPLANT
GOWN STRL REUS W/TWL LRG LVL3 (GOWN DISPOSABLE)
GOWN STRL REUS W/TWL XL LVL3 (GOWN DISPOSABLE) ×15 IMPLANT
HANDLE STAPLE EGIA 4 XL (STAPLE) ×3 IMPLANT
HOLDER FOLEY CATH W/STRAP (MISCELLANEOUS) ×3 IMPLANT
HOVERMATT SINGLE USE (MISCELLANEOUS) ×3 IMPLANT
IRRIG SUCT STRYKERFLOW 2 WTIP (MISCELLANEOUS) ×3
IRRIGATION SUCT STRKRFLW 2 WTP (MISCELLANEOUS) ×1 IMPLANT
KIT BASIN OR (CUSTOM PROCEDURE TRAY) ×3 IMPLANT
KIT GASTRIC LAVAGE 34FR ADT (SET/KITS/TRAYS/PACK) ×3 IMPLANT
LIQUID BAND (GAUZE/BANDAGES/DRESSINGS) ×3 IMPLANT
MARKER SKIN DUAL TIP RULER LAB (MISCELLANEOUS) ×3 IMPLANT
NEEDLE SPNL 22GX3.5 QUINCKE BK (NEEDLE) ×3 IMPLANT
PACK CARDIOVASCULAR III (CUSTOM PROCEDURE TRAY) ×3 IMPLANT
RELOAD EGIA 45 MED/THCK PURPLE (STAPLE) ×3 IMPLANT
RELOAD EGIA 45 TAN VASC (STAPLE) IMPLANT
RELOAD EGIA 60 MED/THCK PURPLE (STAPLE) ×15 IMPLANT
RELOAD EGIA 60 TAN VASC (STAPLE) ×9 IMPLANT
RELOAD ENDO STITCH 2.0 (ENDOMECHANICALS) ×10
SCISSORS LAP 5X45 EPIX DISP (ENDOMECHANICALS) ×3 IMPLANT
SHEARS HARMONIC ACE PLUS 45CM (MISCELLANEOUS) ×3 IMPLANT
SLEEVE XCEL OPT CAN 5 100 (ENDOMECHANICALS) ×9 IMPLANT
SUT ETHIBOND 0 36 GRN (SUTURE) IMPLANT
SUT ETHILON 2 0 PS N (SUTURE) IMPLANT
SUT MNCRL AB 4-0 PS2 18 (SUTURE) ×3 IMPLANT
SUT RELOAD ENDO STITCH 2.0 (ENDOMECHANICALS) ×5
SUT SILK 0 SH 30 (SUTURE) ×3 IMPLANT
SUT VICRYL 0 UR6 27IN ABS (SUTURE) ×3 IMPLANT
SUTURE RELOAD ENDO STITCH 2.0 (ENDOMECHANICALS) ×5 IMPLANT
SYR 20CC LL (SYRINGE) ×3 IMPLANT
TOWEL OR 17X26 10 PK STRL BLUE (TOWEL DISPOSABLE) ×3 IMPLANT
TOWEL OR NON WOVEN STRL DISP B (DISPOSABLE) ×3 IMPLANT
TRAY FOLEY W/METER SILVER 14FR (SET/KITS/TRAYS/PACK) ×3 IMPLANT
TROCAR BLADELESS OPT 5 100 (ENDOMECHANICALS) IMPLANT
TROCAR XCEL 12X100 BLDLESS (ENDOMECHANICALS) ×3 IMPLANT
TUBING ENDO SMARTCAP PENTAX (MISCELLANEOUS) ×3 IMPLANT
TUBING INSUF HEATED (TUBING) ×3 IMPLANT

## 2015-08-15 NOTE — Transfer of Care (Signed)
Immediate Anesthesia Transfer of Care Note  Patient: Lindsay MaltaCristina Moura  Procedure(s) Performed: Procedure(s): LAPAROSCOPIC ROUX-EN-Y GASTRIC BYPASS WITH HIATAL HERNIA REPAIR, UPPER ENDO (N/A)  Patient Location: PACU  Anesthesia Type:General  Level of Consciousness: awake, alert , oriented and patient cooperative  Airway & Oxygen Therapy: Patient Spontanous Breathing and Patient connected to face mask oxygen  Post-op Assessment: Report given to RN, Post -op Vital signs reviewed and stable and Patient moving all extremities X 4  Post vital signs: stable  Last Vitals:  Vitals:   08/15/15 0842  BP: (!) 149/44  Pulse: 91  Resp: 16  Temp: 36.9 C    Last Pain:  Vitals:   08/15/15 0913  TempSrc:   PainSc: 7       Patients Stated Pain Goal: 3 (08/15/15 0913)  Complications    none

## 2015-08-15 NOTE — H&P (Signed)
History of Present Illness Lindsay [name](Lindsay Jeon A. Lindsay Aldaco MD; 08/10/2015 10:45 AM) Patient words: pre-op.  The patient is a 55 year old female who presents for a bariatric surgery evaluation. Patient is completed all preoperative workup and has been cleared for gastric bypass. She has lost 4 pounds in the preoperative phase. She is currently eating her preop diet has no issues at this time. Her only medication changes decrease in at meals insulin in the preoperative phase. She had eye surgery in March.   Allergies Doristine Devoid(Chemira Jones, CMA; 08/10/2015 10:14 AM) No Known Drug Allergies03/29/2017  Medication History (Doristine DevoidChemira Jones, CMA; 08/10/2015 10:14 AM) Simvastatin (20MG  Tablet, Oral) Active. Fenofibrate (145MG  Tablet, Oral) Active. DULoxetine HCl (30MG  Capsule DR Part, Oral) Active. Glucosamine Chondr 1500 Complx (Oral) Active. Levemir FlexTouch (100UNIT/ML Soln Pen-inj, Subcutaneous) Active. NovoLOG FlexPen (100UNIT/ML Soln Pen-inj, Subcutaneous) Active. Gabapentin (100MG  Capsule, Oral) Active. Farxiga (10MG  Tablet, Oral) Active. Ramipril (10MG  Capsule, Oral) Active. Biotin (5000MCG Tablet, Oral) Active. Medications Reconciled    Review of Systems Lindsay [name]([name] A. Louvina Cleary MD; 08/10/2015 10:45 AM) General Present- Fatigue and Night Sweats. Not Present- Appetite Loss, Chills, Fever, Weight Gain and Weight Loss. Skin Not Present- Change in Wart/Mole, Dryness, Hives, Jaundice, New Lesions, Non-Healing Wounds, Rash and Ulcer. HEENT Present- Visual Disturbances. Not Present- Earache, Hearing Loss, Hoarseness, Nose Bleed, Oral Ulcers, Ringing in the Ears, Seasonal Allergies, Sinus Pain, Sore Throat, Wears glasses/contact lenses and Yellow Eyes. Respiratory Not Present- Bloody sputum, Chronic Cough, Difficulty Breathing, Snoring and Wheezing. Breast Not Present- Breast Mass, Breast Pain, Nipple Discharge and Skin Changes. Cardiovascular Not Present- Chest Pain, Difficulty Breathing Lying Down, Leg  Cramps, Palpitations, Rapid Heart Rate, Shortness of Breath and Swelling of Extremities. Gastrointestinal Present- Hemorrhoids. Not Present- Abdominal Pain, Bloating, Bloody Stool, Change in Bowel Habits, Chronic diarrhea, Constipation, Difficulty Swallowing, Excessive gas, Gets full quickly at meals, Indigestion, Nausea, Rectal Pain and Vomiting. Female Genitourinary Not Present- Frequency, Nocturia, Painful Urination, Pelvic Pain and Urgency. Musculoskeletal Present- Back Pain, Joint Pain and Joint Stiffness. Not Present- Muscle Pain, Muscle Weakness and Swelling of Extremities. Neurological Not Present- Decreased Memory, Fainting, Headaches, Numbness, Seizures, Tingling, Tremor, Trouble walking and Weakness. Psychiatric Present- Depression. Not Present- Anxiety, Bipolar, Change in Sleep Pattern, Fearful and Frequent crying. Endocrine Not Present- Cold Intolerance, Excessive Hunger, Hair Changes, Heat Intolerance, Hot flashes and New Diabetes. Hematology Not Present- Easy Bruising, Excessive bleeding, Gland problems, HIV and Persistent Infections.  Vitals (Chemira Jones CMA; 08/10/2015 10:14 AM) 08/10/2015 10:13 AM Weight: 208.6 lb Height: 61in Body Surface Area: 1.92 m Body Mass Index: 39.41 kg/m  Temp.: 25F(Oral)  Pulse: 87 (Regular)  BP: 126/78 (Sitting, Left Arm, Standard)       Physical Exam Lindsay [name](Ivy Meriwether A. Sadae Arrazola MD; 08/10/2015 10:45 AM) General Mental Status-Alert. General Appearance-Cooperative. Orientation-Oriented X4. Build & Nutrition-Obese. Posture-Normal posture.  Integumentary Global Assessment Upon inspection and palpation of skin surfaces of the - Head/Face: no rashes, ulcers, lesions or evidence of photo damage. No palpable nodules or masses and Neck: no visible lesions or palpable masses.  Head and Neck Head-normocephalic, atraumatic with no lesions or palpable masses. Face Global Assessment - atraumatic. Thyroid Gland Characteristics -  normal size and consistency.  Eye Eyeball - Bilateral-Extraocular movements intact. Sclera/Conjunctiva - Bilateral-No scleral icterus, No Discharge.  ENMT Nose and Sinuses External Inspection of the Nose - no deformities observed, no swelling present.  Chest and Lung Exam Palpation Palpation of the chest reveals - Non-tender. Auscultation Breath sounds - Normal.  Cardiovascular Auscultation Rhythm - Regular. Heart Sounds - S1  WNL and S2 WNL. Carotid arteries - No Carotid bruit.  Abdomen Inspection Inspection of the abdomen reveals - No Visible peristalsis, No Abnormal pulsations and No Paradoxical movements. Palpation/Percussion Palpation and Percussion of the abdomen reveal - Soft, Non Tender, No Rebound tenderness, No Rigidity (guarding), No hepatosplenomegaly and No Palpable abdominal masses.  Peripheral Vascular Upper Extremity Palpation - Pulses bilaterally normal. Lower Extremity Palpation - Edema - Bilateral - No edema.  Neurologic Neurologic evaluation reveals -normal sensation and normal coordination.  Neuropsychiatric Mental status exam performed with findings of-able to articulate well with normal speech/language, rate, volume and coherence and thought content normal with ability to perform basic computations and apply abstract reasoning.  Musculoskeletal Normal Exam - Bilateral-Upper Extremity Strength Normal and Lower Extremity Strength Normal.    Assessment & Plan Lindsay [name](Jamiria Langill A. Rica Heather MD; 08/10/2015 10:47 AM) MORBID OBESITY (E66.6601) Story: 55 year old female with morbid obesity and diabetes mellitus with multiple complications. She is also 4 pounds and preoperative workup and has decreased her insulin dose during the preoperative diet. On workup she was diagnosed with a hiatal hernia which we will assess the time of surgery and also presbyesophagus but had normal manometry. Impression: The patient meets weight loss surgery criteria. Due to the above  reasons, I think laparoscopic vertical sleeve gastrectomy is the best option for the patient.  We discussed RNY gastric bypass. We discussed the preoperative, operative and postoperative process. I explained the surgery in detail including the performance of an EGD near the end of the surgery to test for leak. We discussed the typical hospital course including a 2-3 day stay baring any complications. The patient was given educational material. I quoted the patient that most patients can lose up to 50-70% of their excess weight. We did discuss the possibility of weight regain several years after the procedure.  The risks of infection, bleeding, pain, scarring, weight regain, too little or too much weight loss, vitamin deficiencies and need for lifelong vitamin supplementation, hair loss, need for protein supplementation, leaks, stricture, reflux, food intolerance, gallstone formation, hernia, need for reoperation, need for open surgery, injury to spleen or surrounding structures, DVT's, PE, and death again discussed with the patient and the patient expressed understanding and desires to proceed with laparoscopic sleeve gastrectomy, possible open, intraoperative endoscopy.  We discussed that before and after surgery that there would be an alteration in their diet. I explained that we have put them on a diet 2 weeks before surgery. I also explained that they would be on a liquid diet for 2 weeks after surgery. We discussed that they would have to avoid certain foods after surgery. We discussed the importance of physical activity as well as compliance with our dietary and supplement recommendations and routine follow-up. DIABETES MELLITUS WITH RETINOPATHY (E11.319) Impression: On Levemir, NovoLog, farxiga by endocrinology

## 2015-08-15 NOTE — Op Note (Signed)
Preop Diagnosis: Obesity Class III  Postop Diagnosis: same  Procedure performed: laparoscopic Roux en Y gastric bypass  Assitant: Jaclynn GuarneriBen Hoxworth  Indications:  The patient is a 55 y.o. year-old morbidly obese female who has been followed in the Bariatric Clinic as an outpatient. This patient was diagnosed with morbid obesity with a BMI of Body mass index is 38.95 kg/m. and significant co-morbidities including hypertension, insulin dependent diabetes and GERD.  The patient was counseled extensively in the Bariatric Outpatient Clinic and after a thorough explanation of the risks and benefits of surgery (including death from complications, bowel leak, infection such as peritonitis and/or sepsis, internal hernia, bleeding, need for blood transfusion, bowel obstruction, organ failure, pulmonary embolus, deep venous thrombosis, wound infection, incisional hernia, skin breakdown, and others entailed on the consent form) and after a compliant diet and exercise program, the patient was scheduled for an elective laparoscopic sleeve gastrectomy.  Description of Operation:  Following informed consent, the patient was taken to the operating room and placed on the operating table in the supine position.  She had previously received prophylactic antibiotics and subcutaneous heparin for DVT prophylaxis in the pre-op holding area.  After induction of general endotracheal anesthesia by the anesthesiologist, the patient underwent placement of sequential compression devices, Foley catheter and an oro-gastric tube.  A timeout was confirmed by the surgery and anesthesia teams.  The patient was adequately padded at all pressure points and placed on a footboard to prevent slippage from the OR table during extremes of position during surgery.  She underwent a routine sterile prep and drape of her entire abdomen.    Next, A transverse incision was made under the left subcostal area and a 5mm optical viewing trocar was introduced  into the peritoneal cavity. Pneumoperitoneum was applied with a high flow and low pressure. A laparoscope was inserted to confirm placement. A extraperitoneal block was then placed at the lateral abdominal wall using 0.25% marcaine with epinephrine . 5 additional trocars were placed: 1 5mm trocar to the left of the midline. 1 additional 5mm trocar in the left lateral area, 1 12mm trocar in the right mid abdomen, and 1 5mm trocar in the right subcostal area.  The greater omentum was flipped over the transverse colon and under the left lobe of the liver. The ligament of trietz was identified. 30cm of jejunum was measured starting from the ligament of Trietz. The mesentery was checked to ensure mobility. Next, a 60mm 2-713mm tristapler was used to divide the jejunum at this location. The harmonic scalpel was used to divide the mesentery down to the origin. A 1/2" penrose was sutured to the distal side. 100cm of jejunum was measured starting at the division. 2-0 silk was used to appose the biliary limb to the 100cm mark of jejunum in 2 places. Enterotomies were made in the biliary and common channels and a 60mm 2-3 tristapler was used to create the J-J anastomosis. A 2-0 silk was used to appose the enterotomy edges and a 600mm 2-3 tristapler was used to close the enterotomy. An anti-obstruction 2-0 silk suture was placed. Next, the mesenteric defect was closed with a 2-0 silk in running fashion.The J-J appeared patent and in neutral position.  Next, the omentum was divided using the Harmonic scalpel. The patient was placed in steep Reverse Trendelenberg position. A Nathanson retracted was placed through a subxiphoid incision and used to retract the liver. The pars flaccida was entered and the posterior hiatus repaired due to concern for hernia. No  hernia was identified and both crus appeared in good position. The fat pad over the fundus was incised to free the fundus. Next, a position along the lesser curve 6cm from  GE junction was identified. The fat over the lesser curve divided to enter the lesser sac. Multiple 60mm 3-374mm tristaple firings were peformed to create a 6cm pouch. The Roux limb was identified using the placed penrose and brought up to the stomach in antecolic fashion. The limb was inspected to ensure a neutral position. A 2-0 vicryl suture was then used to create a posterior layer connecting the stomach to the Roux limb jejunum in running fashion. Next cautery was used to create an enterotomy along the medial aspect of this suture line and Harmonic scalpel used to create gastotomy. A 45mm 3-554mm tristapler was then used to create a 25-3030mm anastomosis. 2 2-0 vicryl sutures were used in running fashion to close the gastrotomy. Finally, a 2-0 vicryl suture was used to close an anterior layer of stomach and jejunum over the anastomosis in running fashion. The penrose was removed from the Roux limb.  The assistant then went and performed an upper endoscopy and leak test. No bubbles were seen and the pouch and limb distended appropriately. The limb and pouch were deflated, the endoscope was removed. Hemostasis was ensured. The 12mm trocar fascial defect was closed with a 0 vicryl. Pneumoperitoneum was evacuated, all ports were removed and all incisions closed with 4-0 monocryl suture in subcuticular fashion. Glue was put in place for dressing. The patient awoke from anesthesia and was brought to pacu in stable condition. All counts were correct.  Specimens:  None  Post-Op Plan:       Pain Management: PO, prn      Antibiotics: Prophylactic      Anticoagulation: Prophylactic, Starting now      Post Op Studies/Consults: Not applicable      Intended Discharge: within 48h      Intended Outpatient Follow-Up: Two Week      Intended Outpatient Studies: Not Applicable      Other: Not Applicable   De BlanchLuke Aaron Ariane Ditullio

## 2015-08-15 NOTE — Anesthesia Procedure Notes (Signed)
Procedure Name: Intubation Performed by: Kizzie FantasiaARVER, Corena Tilson J Pre-anesthesia Checklist: Patient identified, Emergency Drugs available, Suction available, Patient being monitored and Timeout performed Patient Re-evaluated:Patient Re-evaluated prior to inductionOxygen Delivery Method: Circle system utilized Preoxygenation: Pre-oxygenation with 100% oxygen Intubation Type: IV induction Ventilation: Oral airway inserted - appropriate to patient size and Mask ventilation without difficulty Laryngoscope Size: Miller and 3 Grade View: Grade II Tube type: Oral Tube size: 7.0 mm Number of attempts: 2 (crna mac 4 blade grade 4 view, MDA miller 2 grade 2 view) Airway Equipment and Method: Stylet Placement Confirmation: ETT inserted through vocal cords under direct vision,  CO2 detector,  breath sounds checked- equal and bilateral and positive ETCO2 Secured at: 21 cm Tube secured with: Tape Dental Injury: Teeth and Oropharynx as per pre-operative assessment

## 2015-08-15 NOTE — Anesthesia Postprocedure Evaluation (Signed)
Anesthesia Post Note  Patient: Lindsay [name]  Procedure(s) Performed: Procedure(s) (LRB): LAPAROSCOPIC ROUX-EN-Y GASTRIC BYPASS WITH HIATAL HERNIA REPAIR, UPPER ENDO (N/A)  Patient location during evaluation: PACU Anesthesia Type: General Level of consciousness: awake, awake and alert and oriented Pain management: pain level controlled Vital Signs Assessment: post-procedure vital signs reviewed and stable Respiratory status: nonlabored ventilation, respiratory function stable and spontaneous breathing Cardiovascular status: blood pressure returned to baseline Anesthetic complications: no    Last Vitals:  Vitals:   08/15/15 1530 08/15/15 1545  BP: (!) 153/61 (!) 150/57  Pulse: 90 91  Resp: (!) 25 (!) 26  Temp: 37.7 C     Last Pain:  Vitals:   08/15/15 1600  TempSrc:   PainSc: 4                  Dewaine Morocho COKER

## 2015-08-15 NOTE — Interval H&P Note (Signed)
History and Physical Interval Note:  08/15/2015 9:07 AM  Buckner MaltaCristina Nations  has presented today for surgery, with the diagnosis of Morbid Obesity, Hiatal Hernia, Hyperlipidemia, HTN, Diabetes Mellitus with Retinopathy  The various methods of treatment have been discussed with the patient and family. After consideration of risks, benefits and other options for treatment, the patient has consented to  Procedure(s): LAPAROSCOPIC ROUX-EN-Y GASTRIC BYPASS WITH HIATAL HERNIA REPAIR, UPPER ENDO (N/A) as a surgical intervention .  The patient's history has been reviewed, patient examined, no change in status, stable for surgery.  I have reviewed the patient's chart and labs.  Questions were answered to the patient's satisfaction.     De BlanchLuke Aaron Kinsinger

## 2015-08-15 NOTE — Op Note (Signed)
Upper GI endoscopy is performed at the completion of laparoscopic Roux-en-Y gastric bypass by Dr. Sheliah HatchKinsinger. The Olympus video endoscope was inserted into the upper esophagus and then passed under direct vision to the EG junction at 41 cm. The small gastric pouch was insufflated with air while the gastric outlet was clamped under irrigation by the operating surgeon. There was no evidence of leak. The anastomosis was visualized and was patent. Suture and staple lines were intact and without bleeding. The pouch was tubular and measured 4-5 cm in length. At the completion of the procedure the pouch was desufflated and the scope withdrawn.

## 2015-08-15 NOTE — Anesthesia Preprocedure Evaluation (Addendum)
Anesthesia Evaluation  Patient identified by MRN, date of birth, ID band Patient awake    Reviewed: Allergy & Precautions, NPO status , Patient's Chart, lab work & pertinent test results  Airway Mallampati: II  TM Distance: >3 FB Neck ROM: Full    Dental  (+) Edentulous Upper, Edentulous Lower   Pulmonary former smoker,    breath sounds clear to auscultation       Cardiovascular  Rhythm:Regular Rate:Normal     Neuro/Psych    GI/Hepatic   Endo/Other  diabetes  Renal/GU      Musculoskeletal   Abdominal (+) + obese,   Peds  Hematology   Anesthesia Other Findings   Reproductive/Obstetrics                             Anesthesia Physical Anesthesia Plan  ASA: III  Anesthesia Plan: General   Post-op Pain Management:    Induction:   Airway Management Planned: Oral ETT  Additional Equipment:   Intra-op Plan:   Post-operative Plan: Extubation in OR  Informed Consent: I have reviewed the patients History and Physical, chart, labs and discussed the procedure including the risks, benefits and alternatives for the proposed anesthesia with the patient or authorized representative who has indicated his/her understanding and acceptance.     Plan Discussed with: CRNA and Anesthesiologist  Anesthesia Plan Comments:         Anesthesia Quick Evaluation

## 2015-08-16 LAB — GLUCOSE, CAPILLARY
GLUCOSE-CAPILLARY: 245 mg/dL — AB (ref 65–99)
Glucose-Capillary: 190 mg/dL — ABNORMAL HIGH (ref 65–99)
Glucose-Capillary: 194 mg/dL — ABNORMAL HIGH (ref 65–99)
Glucose-Capillary: 234 mg/dL — ABNORMAL HIGH (ref 65–99)
Glucose-Capillary: 212 mg/dL — ABNORMAL HIGH (ref 65–99)
Glucose-Capillary: 205 mg/dL — ABNORMAL HIGH (ref 65–99)

## 2015-08-16 LAB — CBC WITH DIFFERENTIAL/PLATELET
BASOS ABS: 0 10*3/uL (ref 0.0–0.1)
BASOS PCT: 0 %
EOS ABS: 0.1 10*3/uL (ref 0.0–0.7)
EOS PCT: 1 %
HEMATOCRIT: 34.9 % — AB (ref 36.0–46.0)
Hemoglobin: 11.6 g/dL — ABNORMAL LOW (ref 12.0–15.0)
Lymphocytes Relative: 27 %
Lymphs Abs: 3.1 10*3/uL (ref 0.7–4.0)
MCH: 30.6 pg (ref 26.0–34.0)
MCHC: 33.2 g/dL (ref 30.0–36.0)
MCV: 92.1 fL (ref 78.0–100.0)
MONO ABS: 1.2 10*3/uL — AB (ref 0.1–1.0)
MONOS PCT: 10 %
Neutro Abs: 7.4 10*3/uL (ref 1.7–7.7)
Neutrophils Relative %: 62 %
PLATELETS: 304 10*3/uL (ref 150–400)
RBC: 3.79 MIL/uL — ABNORMAL LOW (ref 3.87–5.11)
RDW: 13.7 % (ref 11.5–15.5)
WBC: 11.8 10*3/uL — ABNORMAL HIGH (ref 4.0–10.5)

## 2015-08-16 LAB — HEMOGLOBIN AND HEMATOCRIT, BLOOD
HEMATOCRIT: 32 % — AB (ref 36.0–46.0)
HEMOGLOBIN: 10.6 g/dL — AB (ref 12.0–15.0)

## 2015-08-16 NOTE — Progress Notes (Signed)
  Progress Note: Metabolic and Bariatric Surgery Service   Subjective: Pain controlled, no nausea or vomiting. Ambulating well.  Objective: Vital signs in last 24 hours: Temp:  [98.3 F (36.8 C)-100.4 F (38 C)] 98.7 F (37.1 C) (08/15 1409) Pulse Rate:  [84-95] 84 (08/15 1409) Resp:  [16-28] 16 (08/15 1409) BP: (124-154)/(46-58) 130/58 (08/15 1409) SpO2:  [92 %-98 %] 95 % (08/15 1409) Last BM Date: 08/14/15  Intake/Output from previous day: 08/14 0701 - 08/15 0700 In: 2418.8 [I.V.:2418.8] Out: 1975 [Urine:1950; Blood:25] Intake/Output this shift: Total I/O In: 1287.5 [P.O.:300; I.V.:937.5; IV Piggyback:50] Out: 650 [Urine:650]  Lungs: CTAB  Cardiovascular: RRR  Abd: soft, ATTP, ND, incisions c/d/i  Extremities:  No edema  Neuro: AOx4  Lab Results: CBC   Recent Labs  08/15/15 1718 08/16/15 0459  WBC 17.1* 11.8*  HGB 11.6* 11.6*  HCT 34.4* 34.9*  PLT 310 304   BMET  Recent Labs  08/15/15 1718  CREATININE 0.70   PT/INR No results for input(s): LABPROT, INR in the last 72 hours. ABG No results for input(s): PHART, HCO3 in the last 72 hours.  Invalid input(s): PCO2, PO2  Studies/Results:  Anti-infectives: Anti-infectives    Start     Dose/Rate Route Frequency Ordered Stop   08/15/15 0839  cefoTEtan in Dextrose 5% (CEFOTAN) IVPB 2 g     2 g Intravenous On call to O.R. 08/15/15 0839 08/15/15 1233      Medications: Scheduled Meds: . Chlorhexidine Gluconate Cloth  6 each Topical Once  . DULoxetine  30 mg Oral BID  . famotidine (PEPCID) IV  20 mg Intravenous Q12H  . gabapentin  100 mg Oral TID  . heparin subcutaneous  5,000 Units Subcutaneous Q8H  . insulin aspart  0-15 Units Subcutaneous Q4H  . [START ON 08/17/2015] protein supplement shake  2 oz Oral Q2H   Continuous Infusions: . dextrose 5 % and 0.45% NaCl 125 mL/hr at 08/15/15 1746   PRN Meds:.oxyCODONE **AND** acetaminophen, acetaminophen (TYLENOL) oral liquid 160 mg/5 mL, morphine  injection, ondansetron (ZOFRAN) IV  Assessment/Plan: Patient Active Problem List   Diagnosis Date Noted  . Preoperative evaluation to rule out surgical contraindication   . Morbid obesity (HCC) 11/18/2014  . Diabetes mellitus without complication (HCC)   . Hyperlipemia    s/p Procedure(s): LAPAROSCOPIC ROUX-EN-Y GASTRIC BYPASS WITH HIATAL HERNIA REPAIR, UPPER ENDO 08/15/2015 -advance to POD 1 diet  Disposition:  LOS: 1 day  The patient will be in the hospital for normal postop protocol  Rodman PickleLuke Aaron Kinsinger, [place] [phone] [facility]

## 2015-08-16 NOTE — Plan of Care (Signed)
Problem: Food- and Nutrition-Related Knowledge Deficit (NB-1.1) Goal: Nutrition education Formal process to instruct or train a patient/client in a skill or to impart knowledge to help patients/clients voluntarily manage or modify food choices and eating behavior to maintain or improve health. Outcome: Completed/Met Date Met: 08/16/15 Nutrition Education Note  Received consult for diet education per DROP protocol.   Discussed 2 week post op diet with pt. Emphasized that liquids must be non carbonated, non caffeinated, and sugar free. Fluid goals discussed. Reviewed progression of diet to include soft proteins at 7-10 days post-op. Pt to follow up with outpatient bariatric RD for further diet progression after 2 weeks. Multivitamins and minerals also reviewed. Teach back method used, pt expressed understanding, expect good compliance.   Diet: First 2 Weeks  You will see the dietitian about two (2) weeks after your surgery. The dietitian will increase the types of foods you can eat if you are handling liquids well:  If you have severe vomiting or nausea and cannot handle clear liquids lasting longer than 1 day, call your surgeon  Protein Shake  Drink at least 2 ounces of shake 5-6 times per day  Each serving of protein shakes (usually 8 - 12 ounces) should have a minimum of:  15 grams of protein  And no more than 5 grams of carbohydrate  Goal for protein each day:  Men = 80 grams per day  Women = 60 grams per day  Protein powder may be added to fluids such as non-fat milk or Lactaid milk or Soy milk (limit to 35 grams added protein powder per serving)   Hydration  Slowly increase the amount of water and other clear liquids as tolerated (See Acceptable Fluids)  Slowly increase the amount of protein shake as tolerated  Sip fluids slowly and throughout the day  May use sugar substitutes in small amounts (no more than 6 - 8 packets per day; i.e. Splenda)   Fluid Goal  The first goal is to  drink at least 8 ounces of protein shake/drink per day (or as directed by the nutritionist); some examples of protein shakes are Johnson [employer] Johnson, [employer]R [employer], EAS Edge HP, and Unjury. See handout from pre-op Bariatric Education Class:  Slowly increase the amount of protein shake you drink as tolerated  You may find it easier to slowly sip shakes throughout the day  It is important to get your proteins in first  Your fluid goal is to drink 64 - 100 ounces of fluid daily  It may take a few weeks to build up to this  32 oz (or more) should be clear liquids  And  32 oz (or more) should be full liquids (see below for examples)  Liquids should not contain sugar, caffeine, or carbonation   Clear Liquids:  Water or Sugar-free flavored water (i.e. Fruit H2O, Propel)  Decaffeinated coffee or tea (sugar-free)  Crystal Lite, Wyler's Lite, Minute Maid Lite  Sugar-free Jell-O  Bouillon or broth  Sugar-free Popsicle: *Less than 20 calories each; Limit 1 per day   Full Liquids:  Protein Shakes/Drinks + 2 choices per day of other full liquids  Full liquids must be:  No More Than 12 grams of Carbs per serving  No More Than 3 grams of Fat per serving  Strained low-fat cream soup  Non-Fat milk  Fat-free Lactaid Milk  Sugar-free yogurt (Dannon Lite & Fit, Greek yogurt)     Clayton Bibles, MS, RD, LDN Pager: [phone] After Hours Pager: [phone]

## 2015-08-16 NOTE — Progress Notes (Signed)
Patient alert and oriented, Post op day 1.  Provided support and encouragement.  Encouraged pulmonary toilet, ambulation and small sips of liquids.  All questions answered.  Will continue to monitor. 

## 2015-08-17 LAB — CBC WITH DIFFERENTIAL/PLATELET
BASOS ABS: 0 10*3/uL (ref 0.0–0.1)
BASOS PCT: 0 %
EOS ABS: 0.2 10*3/uL (ref 0.0–0.7)
EOS PCT: 2 %
HCT: 29.7 % — ABNORMAL LOW (ref 36.0–46.0)
HEMOGLOBIN: 10.6 g/dL — AB (ref 12.0–15.0)
Lymphocytes Relative: 24 %
Lymphs Abs: 3.1 10*3/uL (ref 0.7–4.0)
MCH: 31.9 pg (ref 26.0–34.0)
MCHC: 35.7 g/dL (ref 30.0–36.0)
MCV: 89.5 fL (ref 78.0–100.0)
Monocytes Absolute: 1 10*3/uL (ref 0.1–1.0)
Monocytes Relative: 8 %
NEUTROS PCT: 66 %
Neutro Abs: 8.3 10*3/uL — ABNORMAL HIGH (ref 1.7–7.7)
PLATELETS: 280 10*3/uL (ref 150–400)
RBC: 3.32 MIL/uL — AB (ref 3.87–5.11)
RDW: 13.4 % (ref 11.5–15.5)
WBC: 12.6 10*3/uL — AB (ref 4.0–10.5)

## 2015-08-17 LAB — GLUCOSE, CAPILLARY
GLUCOSE-CAPILLARY: 187 mg/dL — AB (ref 65–99)
GLUCOSE-CAPILLARY: 207 mg/dL — AB (ref 65–99)
GLUCOSE-CAPILLARY: 225 mg/dL — AB (ref 65–99)

## 2015-08-17 MED ORDER — INSULIN DETEMIR 100 UNIT/ML FLEXPEN: 5.0000 [IU] | PEN_INJECTOR | Freq: Two times a day (BID) | SUBCUTANEOUS | 11 refills | Status: DC

## 2015-08-17 MED ORDER — POTASSIUM CHLORIDE IN NACL 20-0.9 MEQ/L-% IV SOLN
INTRAVENOUS | Status: DC
  Administered 2015-08-17: 1000 mL via INTRAVENOUS
  Filled 2015-08-17: qty 1000

## 2015-08-17 MED ORDER — NOVOLOG FLEXPEN 100 UNIT/ML ~~LOC~~ SOPN: PEN_INJECTOR | SUBCUTANEOUS | 11 refills | Status: DC

## 2015-08-17 NOTE — Progress Notes (Signed)
Patient alert and oriented, pain is controlled. Patient is tolerating fluids, advanced to protein shake today, patient tolerating well. Reviewed Gastric Bypass discharge instructions with patient and patient is able to articulate understanding. Provided information on BELT program, Support Group and [facility] outpatient pharmacy. All questions answered, will continue to monitor.    

## 2015-08-17 NOTE — Progress Notes (Signed)
Patient given discharge instructions and prescriptions questions answered

## 2015-08-17 NOTE — Discharge Instructions (Signed)

## 2015-08-22 NOTE — Discharge Summary (Signed)
Physician Discharge Summary  Buckner MaltaCristina Deniston QIH:[id number]:[id number] DOB: [birth year] DOA: 08/15/2015  PCP: Aura DialsBOUSKA,DAVID E, MD  Admit date: 08/15/2015 Discharge date: 08/22/2015  Recommendations for Outpatient Follow-up:  1.  (include homehealth, outpatient follow-up instructions, specific recommendations for PCP to follow-up on, etc.)  Follow-up Information    Rodman PickleLuke Aaron Maricia Scotti, MD .   Specialty:  General Surgery Contact information: [address] [place] [address] [phone]        Rodman PickleLuke Aaron Claudell Wohler, MD .   Specialty:  General Surgery Contact information: [address] [place] [address] [phone]          Discharge Diagnoses:  Active Problems:   Morbid obesity (HCC)   Surgical Procedure: Laparoscopic Sleeve Gastrectomy, upper endoscopy  Discharge Condition: Good Disposition: Home  Diet recommendation: Postoperative sleeve gastrectomy diet (liquids only)  Filed Weights   08/15/15 0842  Weight: 93.5 kg (206 lb 2 oz)     Hospital Course:  The patient was admitted after undergoing Roux-en-Y gastric bypass. POD 0 she ambulated well. POD 1 she was started on the water diet protocol and tolerated 300 ml in the first shift. Once meeting the water amount she was advanced to bariatric protein shakes which they tolerated and were discharged home POD 2.  Treatments: surgery: Roux-en-Y gastric bypass  Discharge Instructions  Discharge Instructions    Call MD for:  difficulty breathing, headache or visual disturbances    Complete by:  As directed   Call MD for:  persistant nausea and vomiting    Complete by:  As directed   Call MD for:  redness, tenderness, or signs of infection (pain, swelling, redness, odor or green/yellow discharge around incision site)    Complete by:  As directed   Call MD for:  severe uncontrolled pain    Complete by:  As directed   Call MD for:  temperature >100.4    Complete by:  As directed   Discharge wound care:     Complete by:  As directed   Ok to shower tomorrow  Glue will likely peel off in 1-3 weeks   Increase activity slowly    Complete by:  As directed   Lifting restrictions    Complete by:  As directed   Do not lift more than 20 pounds for 3-4 weeks       Medication List    STOP taking these medications   Biotin 5000 MCG Tabs   oxyCODONE-acetaminophen 10-325 MG tablet Commonly known as:  PERCOCET     TAKE these medications   B-D UF III MINI PEN NEEDLES 31G X 5 MM Misc Generic drug:  Insulin Pen Needle Use 3 times a day   B-D ULTRAFINE III SHORT PEN 31G X 8 MM Misc Generic drug:  Insulin Pen Needle   DULoxetine 30 MG capsule Commonly known as:  CYMBALTA Take 30 mg by mouth 2 (two) times daily.   FARXIGA 10 MG Tabs tablet Generic drug:  dapagliflozin propanediol Take 10 mg by mouth daily. Notes to patient:  Monitor Blood Sugar Frequently and keep a log for primary care physician, you may need to adjust medication dosage with rapid weight loss.     fenofibrate 145 MG tablet Commonly known as:  TRICOR Take 145 mg by mouth daily.   gabapentin 100 MG capsule Commonly known as:  NEURONTIN Take 100 mg by mouth 3 (three) times daily.   Insulin Detemir 100 UNIT/ML Pen Commonly known as:  LEVEMIR FLEXTOUCH Inject  5 Units into the skin 2 (two) times daily. What changed:  how much to take Notes to patient:  Monitor Blood Sugar Frequently and keep a log for primary care physician, you may need to adjust medication dosage with rapid weight loss.     NOVOLOG FLEXPEN 100 UNIT/ML FlexPen Generic drug:  insulin aspart Sliding scale CBG < 70: implement hypoglycemia protocol CBG 70 - 120: 0 units CBG 121 - 150: 2 units CBG 151 - 200: 3 units CBG 201 - 250: 5 units CBG 251 - 300: 8 units CBG 301 - 350: 11 units CBG 351 - 400: 15 units What changed:  how much to take  how to take this  when to take this  additional instructions Notes to patient:  Monitor Blood Sugar Frequently  and keep a log for primary care physician, you may need to adjust medication dosage with rapid weight loss.     ONE TOUCH ULTRA TEST test strip Generic drug:  glucose blood Use four times a day   OVER THE COUNTER MEDICATION Take 2 capsules by mouth daily. Glucosamine w/ chondroitin   ramipril 10 MG capsule Commonly known as:  ALTACE Take 10 mg by mouth daily. Notes to patient:  Monitor Blood Pressure Daily and keep a log for primary care physician.  You may need to make changes to your medications with rapid weight loss.     simvastatin 20 MG tablet Commonly known as:  ZOCOR Take 20 mg by mouth daily.      Follow-up Information    Rodman PickleLuke Aaron Laurin Paulo, MD .   Specialty:  General Surgery Contact information: [address] [place] [address] [phone]        Rodman PickleLuke Aaron Jojo Geving, MD .   Specialty:  General Surgery Contact information: [address] [place] [address] [phone]            The results of significant diagnostics from this hospitalization (including imaging, microbiology, ancillary and laboratory) are listed below for reference.    Significant Diagnostic Studies: No results found.  Labs: Basic Metabolic Panel:  Recent Labs Lab 08/15/15 1718  CREATININE 0.70   Liver Function Tests: No results for input(s): AST, ALT, ALKPHOS, BILITOT, PROT, ALBUMIN in the last 168 hours.  CBC:  Recent Labs Lab 08/15/15 1543 08/15/15 1718 08/16/15 0459 08/16/15 1605 08/17/15 0504  WBC  --  17.1* 11.8*  --  12.6*  NEUTROABS  --   --  7.4  --  8.3*  HGB 11.8* 11.6* 11.6* 10.6* 10.6*  HCT 35.2* 34.4* 34.9* 32.0* 29.7*  MCV  --  88.7 92.1  --  89.5  PLT  --  310 304  --  280    CBG:  Recent Labs Lab 08/16/15 1600 08/16/15 1946 08/16/15 2353 08/17/15 0346 08/17/15 0801  GLUCAP 212* 205* 207* 225* 187*    Active Problems:   Morbid obesity (HCC)   Time coordinating discharge: <7715min

## 2015-08-30 ENCOUNTER — Encounter: Payer: BC Managed Care – PPO | Attending: General Surgery

## 2015-08-30 DIAGNOSIS — Z713 Dietary counseling and surveillance: Secondary | ICD-10-CM | POA: Insufficient documentation

## 2015-08-30 NOTE — Progress Notes (Signed)
Bariatric Class:  Appt start time: 1530 end time:  1630.  2 Week Post-Operative Nutrition Class  Patient was seen on 08/30/2015 for Post-Operative Nutrition education [facility].   Surgery date: 08/15/2015 Surgery type: RYGB Start weight at [facility]: 213.5 lbs, 220.2 lbs on 07/25/2015 Weight today:193.0 lbs  Weight change: 27.2 lbs  TANITA  BODY COMP RESULTS  07/25/15 08/30/15   BMI (kg/m^2) 41.6 36.5   Fat Mass (lbs) 106.6 92.6   Fat Free Mass (lbs) 113.6 100.4   Total Body Water (lbs) 82.2 71.6   The following the learning objectives were met by the patient during this course:  Identifies Phase 3A (Soft, High Proteins) Dietary Goals and will begin from 2 weeks post-operatively to 2 months post-operatively  Identifies appropriate sources of fluids and proteins   States protein recommendations and appropriate sources post-operatively  Identifies the need for appropriate texture modifications, mastication, and bite sizes when consuming solids  Identifies appropriate multivitamin and calcium sources post-operatively  Describes the need for physical activity post-operatively and will follow MD recommendations  States when to call healthcare provider regarding medication questions or post-operative complications  Handouts given during class include:  Phase 3A: Soft, High Protein Diet Handout  Follow-Up Plan: Patient will follow-up at [facility] in 6 weeks for 2 month post-op nutrition visit for diet advancement per MD.

## 2015-09-27 ENCOUNTER — Other Ambulatory Visit: Payer: Self-pay | Admitting: General Surgery

## 2015-09-27 DIAGNOSIS — Z9884 Bariatric surgery status: Secondary | ICD-10-CM

## 2015-10-03 ENCOUNTER — Ambulatory Visit
Admission: RE | Admit: 2015-10-03 | Discharge: 2015-10-03 | Disposition: A | Discharge status: Home / Self Care | Payer: BC Managed Care – PPO | Source: Ambulatory Visit | Attending: General Surgery | Admitting: General Surgery

## 2015-10-03 DIAGNOSIS — Z9884 Bariatric surgery status: Secondary | ICD-10-CM

## 2015-10-11 ENCOUNTER — Encounter: Payer: BC Managed Care – PPO | Attending: General Surgery | Admitting: Dietician

## 2015-10-11 DIAGNOSIS — Z713 Dietary counseling and surveillance: Secondary | ICD-10-CM | POA: Insufficient documentation

## 2015-10-11 NOTE — Progress Notes (Signed)
  Follow-up visit:  8 Weeks Post-Operative RYGB Surgery  Medical Nutrition Therapy:  Appt start time: 1135 end time:  1215.  Primary concerns today: Post-operative Bariatric Surgery Nutrition Management. Returns with a 19.4 lb weight loss in past 6 weeks. Wears dentures and bottom dentures aren't fitting right. Planning to go back to dentist to get them fitted. Feels like she is having trouble chewing meats well which is making getting in these foods difficult. Chicken doesn't taste as good anymore. Doesn't like egg, cheese, or homemade sausage as much as before. Not interested in food overall now. Has a bad taste in her mouth. Does not like the taste of water.   Getting protein in through shakes.   Had an upper GI since food felt like it was getting stuck in her esophagus.   Surgery date: 08/15/2015 Surgery type: RYGB [facility]: 213.5 lbs, 220.2 lbs on 07/25/2015 Weight today: 173.6 lbs lbs  Weight change: 19.4 lbs Total weight loss: 46.6  TANITA  BODY COMP RESULTS  07/25/15 08/30/15 10/11/15   BMI (kg/m^2) 41.6 36.5 32.8   Fat Mass (lbs) 106.6 92.6 77.0   Fat Free Mass (lbs) 113.6 100.4 96.6   Total Body Water (lbs) 82.2 71.6 68.2    Preferred Learning Style:   No preference indicated   Learning Readiness:   Ready  24-hr recall: B (AM): Premier Protein (30 g) Snk (AM): none or SF Jello   L (PM): Premier Protein (30 g) Snk (PM): none or SF Jello or bouillon cube with scrambled egg (0-6 g)  D (PM): 1-1.5 oz chicken, meatloaf, steak (7-10 g) Snk (PM): none  Fluid intake: 22 oz protein shakes, 10 oz boullion, 10 oz jello, 8-16 oz hot tea with lemon, crystal light lemonade sometimes, Twist 50-60 oz  Estimated total protein intake: 60+ grams   Medications: see list  Supplementation: taking some calcium, bought flinstones vitamins, takes B12 1 x week (vitamin bloodwork was ok)   CBG monitoring: test in morning (not everyday) Average CBG per patient: 93-140  mg/dl Last patient reported [id number]: 5.9% at the end of September   Using straws: No Drinking while eating: No Hair loss: No Carbonated beverages: No N/V/D/C: feeling constipated and taking colace (goes every 3-4 days) Dumping syndrome: No  Recent physical activity:  Goes 4 x week treadmill 30 minutes and increasing time, speed, and incline  Progress Towards Goal(s):  In progress.  Handouts given during visit include:  Phase 3B High Protein + Non Starchy vegetables   Nutritional Diagnosis:  -3.3 Overweight/obesity related to past poor dietary habits and physical inactivity as evidenced by patient w/ recent RYGB surgery following dietary guidelines for continued weight loss.    Intervention:  Nutrition education/diet advancement. Goals:  Follow Phase 3B: High Protein + Non-Starchy Vegetables  Eat 3-6 small meals/snacks, every 3-5 hrs  Increase lean protein foods to meet 60g goal  Increase fluid intake to 64oz +  Avoid drinking 15 minutes before, during and 30 minutes after eating  Aim for >30 min of physical activity daily   Keeping trying protein foods (fish, beans, Dannon Light & Fit carb and sugar control, egg)   Teaching Method Utilized:  Visual Auditory Hands on  Barriers to learning/adherence to lifestyle change: none  Demonstrated degree of understanding via:  Teach Back   Monitoring/Evaluation:  Dietary intake, exercise, and body weight. Follow up in 1 months for 3 month post-op visit.

## 2015-10-11 NOTE — Patient Instructions (Addendum)
Goals:  Follow Phase 3B: High Protein + Non-Starchy Vegetables  Eat 3-6 small meals/snacks, every 3-5 hrs  Increase lean protein foods to meet 60g goal  Increase fluid intake to 64oz +  Avoid drinking 15 minutes before, during and 30 minutes after eating  Aim for >30 min of physical activity daily   Keeping trying protein foods (fish, beans, Dannon Light & Fit carb and sugar control, egg)   Surgery date: 08/15/2015 Surgery type: RYGB [facility]: 213.5 lbs, 220.2 lbs on 07/25/2015 Weight today: 173.6 lbs   Weight change: 19.4 lbs Total weight loss: 46.6 lbs   TANITA  BODY COMP RESULTS  07/25/15 08/30/15 10/11/15   BMI (kg/m^2) 41.6 36.5 32.8   Fat Mass (lbs) 106.6 92.6 77.0   Fat Free Mass (lbs) 113.6 100.4 96.6   Total Body Water (lbs) 82.2 71.6 68.2

## 2015-11-14 ENCOUNTER — Encounter: Payer: BC Managed Care – PPO | Attending: General Surgery | Admitting: Dietician

## 2015-11-14 DIAGNOSIS — Z713 Dietary counseling and surveillance: Secondary | ICD-10-CM | POA: Diagnosis not present

## 2015-11-14 NOTE — Progress Notes (Signed)
Follow-up visit:  3 Months Post-Operative RYGB Surgery  Medical Nutrition Therapy:  Appt start time: 505 end time:  550  Primary concerns today: Post-operative Bariatric Surgery Nutrition Management. Returns with a 6.6 lb weight loss and 12.2 lbs fat mass loss in past month. Things are going much better than last time. Has trouble chewing steak but can eat ground meat and chicken. Taste changes are getting better.   Blood sugar is a lot better. No longer taking diabetes medication! Taking less cholesterol medication.   Wears dentures and bottom dentures aren't fitting right. Planning to go back to dentist to get them fitted.   Has one shake per day.  Surgery date: 08/15/2015 Surgery type: RYGB [facility] weight at [name]: 213.5 lbs, 220.2 lbs on 07/25/2015 Weight today: 167.8 lbs   Weight change: 6.6 lbs, 12.2 fat mass loss Total weight loss: 53.2 lbs  Weight loss goal: 130 lbs  TANITA  BODY COMP RESULTS  07/25/15 08/30/15 10/11/15 11/14/15   BMI (kg/m^2) 41.6 36.5 32.8 31.7   Fat Mass (lbs) 106.6 92.6 77.0 64.8   Fat Free Mass (lbs) 113.6 100.4 96.6 103.0   Total Body Water (lbs) 82.2 71.6 68.2 72.6    Preferred Learning Style:   No preference indicated   Learning Readiness:   Ready  24-hr recall: B (AM): Premier Protein or eggs with bacon or sausage(15-30 g) Snk (AM): none or SF Jello   L (PM): Premier Protein or scrambled egg with cheese or egg salad with veggies (15-30 g) Snk (PM): none or SF Jello or chicken broth with scrambled egg (0-6 g)  D (PM): 1.5 oz chicken, meatloaf, steak (10 g) Snk (PM): none  Fluid intake: 11-22 oz protein shakes, 10 oz boullion, 10 oz jello, 8-16 oz hot tea with lemon, crystal light lemonade sometimes, Twist, hot tea, 64 oz or more Estimated total protein intake: 60+ grams   Medications: see list  Supplementation: taking some calcium, bought flinstones vitamins, takes B12 1 x week (vitamin bloodwork was ok)   CBG monitoring: test in morning  (not everyday) Average CBG per patient: 93-140 mg/dl Last patient reported [id number]: 5.9% at the end of September   Using straws: used one once  Drinking while eating: No Hair loss: No Carbonated beverages: No N/V/D/C: feeling constipated and taking colace not as often (goes every 2-3 days) Dumping syndrome: No  Recent physical activity:  Goes 4 x week treadmill 30 minutes and increasing time, speed, and incline and added weight training  Progress Towards Goal(s):  In progress.  Handouts given during visit include:  Phase 3B High Protein + Non Starchy vegetables  High protein snacks   Nutritional Diagnosis:  [place]-3.3 Overweight/obesity related to past poor dietary habits and physical inactivity as evidenced by patient w/ recent RYGB surgery following dietary guidelines for continued weight loss.    Intervention:  Nutrition education/diet advancement. Goals:  Follow Phase 3B: High Protein + Non-Starchy Vegetables  Eat 3-6 small meals/snacks, every 3-5 hrs  Increase lean protein foods to meet 60g goal  Increase fluid intake to 64oz +  Avoid drinking 15 minutes before, during and 30 minutes after eating  Aim for >30 min of physical activity daily   Eat protein first, then vegetables, and then have carbs last  Teaching Method Utilized:  Visual Auditory Hands on  Barriers to learning/adherence to lifestyle change: none  Demonstrated degree of understanding via:  Teach Back   Monitoring/Evaluation:  Dietary intake, exercise, and body weight. Follow up in 3 months  for 6 month post-op visit.

## 2015-11-14 NOTE — Patient Instructions (Addendum)
Goals:  Follow Phase 3B: High Protein + Non-Starchy Vegetables  Eat 3-6 small meals/snacks, every 3-5 hrs  Increase lean protein foods to meet 60g goal  Increase fluid intake to 64oz +  Avoid drinking 15 minutes before, during and 30 minutes after eating  Aim for >30 min of physical activity daily   Eat protein first, then vegetables, and then have carbs last  Surgery date: 08/15/2015 Surgery type: RYGB [facility]: [facility].5 lbs, 220.2 lbs on 07/25/2015 Weight today: 167.8 lbs   Weight change: 6.6 lbs, 12.2 lbs fat mass Total weight loss: 53.2 lbs  Weight loss goal: 130 lbs  TANITA  BODY COMP RESULTS  07/25/15 08/30/15 10/11/15 11/14/15   BMI (kg/m^2) 41.6 36.5 32.8 31.7   Fat Mass (lbs) 106.6 92.6 77.0 64.8   Fat Free Mass (lbs) 113.6 100.4 96.6 103.0   Total Body Water (lbs) 82.2 71.6 68.2 72.6

## 2015-11-18 ENCOUNTER — Ambulatory Visit: Payer: BC Managed Care – PPO | Admitting: Cardiovascular Disease

## 2016-01-09 ENCOUNTER — Emergency Department (HOSPITAL_COMMUNITY): Payer: BC Managed Care – PPO

## 2016-01-09 ENCOUNTER — Encounter (HOSPITAL_COMMUNITY): Payer: Self-pay

## 2016-01-09 ENCOUNTER — Emergency Department (HOSPITAL_COMMUNITY)
Admission: EM | Admit: 2016-01-09 | Discharge: 2016-01-09 | Disposition: A | Discharge status: Home / Self Care | Payer: BC Managed Care – PPO | Attending: Emergency Medicine | Admitting: Emergency Medicine

## 2016-01-09 DIAGNOSIS — N201 Calculus of ureter: Secondary | ICD-10-CM | POA: Diagnosis not present

## 2016-01-09 DIAGNOSIS — R109 Unspecified abdominal pain: Secondary | ICD-10-CM

## 2016-01-09 DIAGNOSIS — Z87891 Personal history of nicotine dependence: Secondary | ICD-10-CM | POA: Diagnosis not present

## 2016-01-09 DIAGNOSIS — Z794 Long term (current) use of insulin: Secondary | ICD-10-CM | POA: Diagnosis not present

## 2016-01-09 DIAGNOSIS — E119 Type 2 diabetes mellitus without complications: Secondary | ICD-10-CM | POA: Insufficient documentation

## 2016-01-09 DIAGNOSIS — R1032 Left lower quadrant pain: Secondary | ICD-10-CM | POA: Diagnosis present

## 2016-01-09 DIAGNOSIS — I10 Essential (primary) hypertension: Secondary | ICD-10-CM | POA: Diagnosis not present

## 2016-01-09 LAB — URINALYSIS, ROUTINE W REFLEX MICROSCOPIC
Bilirubin Urine: NEGATIVE
Glucose, UA: NEGATIVE mg/dL
KETONES UR: NEGATIVE mg/dL
Leukocytes, UA: NEGATIVE
NITRITE: NEGATIVE
PH: 5 (ref 5.0–8.0)
PROTEIN: 30 mg/dL — AB
Specific Gravity, Urine: 1.026 (ref 1.005–1.030)

## 2016-01-09 LAB — COMPREHENSIVE METABOLIC PANEL
ALT: 23 U/L (ref 14–54)
ANION GAP: 7 (ref 5–15)
AST: 15 U/L (ref 15–41)
Albumin: 3.9 g/dL (ref 3.5–5.0)
Alkaline Phosphatase: 83 U/L (ref 38–126)
BUN: 18 mg/dL (ref 6–20)
CO2: 25 mmol/L (ref 22–32)
Calcium: 8.9 mg/dL (ref 8.9–10.3)
Chloride: 108 mmol/L (ref 101–111)
Creatinine, Ser: <0.3 mg/dL — ABNORMAL LOW (ref 0.44–1.00)
GLUCOSE: 167 mg/dL — AB (ref 65–99)
POTASSIUM: 3.7 mmol/L (ref 3.5–5.1)
SODIUM: 140 mmol/L (ref 135–145)
Total Bilirubin: 0.6 mg/dL (ref 0.3–1.2)
Total Protein: 7.2 g/dL (ref 6.5–8.1)

## 2016-01-09 LAB — CBC
HEMATOCRIT: 36.4 % (ref 36.0–46.0)
HEMOGLOBIN: 12.5 g/dL (ref 12.0–15.0)
MCH: 29.1 pg (ref 26.0–34.0)
MCHC: 34.3 g/dL (ref 30.0–36.0)
MCV: 84.8 fL (ref 78.0–100.0)
Platelets: 293 10*3/uL (ref 150–400)
RBC: 4.29 MIL/uL (ref 3.87–5.11)
RDW: 13.2 % (ref 11.5–15.5)
WBC: 13.9 10*3/uL — ABNORMAL HIGH (ref 4.0–10.5)

## 2016-01-09 LAB — LIPASE, BLOOD: Lipase: 23 U/L (ref 11–51)

## 2016-01-09 LAB — I-STAT BETA HCG BLOOD, ED (MC, WL, AP ONLY): I-stat hCG, quantitative: <5 m[IU]/mL (ref <5)

## 2016-01-09 MED ORDER — TAMSULOSIN HCL 0.4 MG PO CAPS: 0.4000 mg | ORAL_CAPSULE | Freq: Every day | ORAL | 0 refills | Status: DC

## 2016-01-09 MED ORDER — TAMSULOSIN HCL 0.4 MG PO CAPS
0.4000 mg | ORAL_CAPSULE | Freq: Every day | ORAL | Status: DC
  Administered 2016-01-09: 0.4 mg via ORAL
  Filled 2016-01-09: qty 1

## 2016-01-09 MED ORDER — IBUPROFEN 200 MG PO TABS
400.0000 mg | ORAL_TABLET | Freq: Once | ORAL | Status: AC | PRN
  Administered 2016-01-09: 400 mg via ORAL
  Filled 2016-01-09: qty 2

## 2016-01-09 MED ORDER — IOPAMIDOL (ISOVUE-300) INJECTION 61%
INTRAVENOUS | Status: AC
  Administered 2016-01-09: 100 mL
  Filled 2016-01-09: qty 100

## 2016-01-09 MED ORDER — OXYCODONE-ACETAMINOPHEN 5-325 MG PO TABS
1.0000 | ORAL_TABLET | Freq: Once | ORAL | Status: AC
  Administered 2016-01-09: 1 via ORAL
  Filled 2016-01-09: qty 1

## 2016-01-09 MED ORDER — OXYCODONE-ACETAMINOPHEN 5-325 MG PO TABS: 1.0000 | ORAL_TABLET | Freq: Four times a day (QID) | ORAL | 0 refills | Status: DC | PRN

## 2016-01-09 NOTE — ED Provider Notes (Signed)
[facility]-EMERGENCY DEPT Provider Note   CSN: [id number] Arrival date & time: 01/09/16  1203   History   Chief Complaint Chief Complaint  Patient presents with  . Flank Pain    HPI Lindsay [name] is a 56 y.o. female.  HPI   [age] female presents today with left flank pain. Patient reports symptoms started approximately a week and half ago. She reports sharp pain left lower flank. She notes symptoms last several hours and then go away on their own. Patient reports that she has been constipated off and on, notes after bowel movement symptoms seemed to improve. Most recently pain began last night and carried on through this morning. She notes she has a history of gastric bypass, informed her surgeon about today's presentation requested she be evaluated in the emergency room. Patient denies any history of kidney stones, denies any urinary complaints, denies any nausea or vomiting. No fevers at home, tolerating by mouth without difficulty.   Past Medical History:  Diagnosis Date  . Arthritis    hands,elbows,hips, back, shoulders  . Depression   . Diabetes mellitus without complication (HCC)   . Hyperlipemia   . Hypertension     Patient Active Problem List   Diagnosis Date Noted  . Preoperative evaluation to rule out surgical contraindication   . Morbid obesity (HCC) 11/18/2014  . Diabetes mellitus without complication (HCC)   . Hyperlipemia     Past Surgical History:  Procedure Laterality Date  . ANTERIOR FUSION CERVICAL SPINE     06-2015 Dr. Lovell SheehanJenkins  . ESOPHAGEAL MANOMETRY N/A 06/27/2015   Procedure: ESOPHAGEAL MANOMETRY (EM);  Surgeon: Napoleon FormKavitha V Nandigam, MD;  Location: [facility];  Service: Endoscopy;  Laterality: N/A;  . EYE SURGERY     macular hole"right eye" and retina detachement x2  . LAPAROSCOPIC ROUX-EN-Y GASTRIC BYPASS WITH HIATAL HERNIA REPAIR N/A 08/15/2015   Procedure: LAPAROSCOPIC ROUX-EN-Y GASTRIC BYPASS WITH HIATAL HERNIA REPAIR, UPPER ENDO;  Surgeon: De BlanchLuke Aaron  Kinsinger, MD;  Location: [facility];  Service: General;  Laterality: N/A;    OB History    No data available       Home Medications    Prior to Admission medications   Medication Sig Start Date End Date Taking? Authorizing Provider  Biotin 2.5 MG CAPS Take 2.5 mg by mouth every morning.   Yes Historical Provider, MD  gabapentin (NEURONTIN) 100 MG capsule Take 100 mg by mouth 3 (three) times daily. 08/30/14  Yes Historical Provider, MD  OVER THE COUNTER MEDICATION Take 1 capsule by mouth daily. Osteo biflex   Yes Historical Provider, MD  ramipril (ALTACE) 10 MG capsule Take 5 mg by mouth every morning.  08/30/14  Yes Historical Provider, MD  simvastatin (ZOCOR) 20 MG tablet Take 20 mg by mouth every morning.    Yes Historical Provider, MD  Insulin Detemir (LEVEMIR FLEXTOUCH) 100 UNIT/ML Pen Inject 5 Units into the skin 2 (two) times daily. Patient not taking: Reported on 01/09/2016 08/17/15   De BlanchLuke Aaron Kinsinger, MD  NOVOLOG FLEXPEN 100 UNIT/ML FlexPen Sliding scale CBG < 70: implement hypoglycemia protocol CBG 70 - 120: 0 units CBG 121 - 150: 2 units CBG 151 - 200: 3 units CBG 201 - 250: 5 units CBG 251 - 300: 8 units CBG 301 - 350: 11 units CBG 351 - 400: 15 units Patient not taking: Reported on 01/09/2016 08/17/15   De BlanchLuke Aaron Kinsinger, MD  oxyCODONE-acetaminophen (PERCOCET/ROXICET) 5-325 MG tablet Take 1 tablet by mouth every 6 (six) hours as needed  for severe pain. 01/09/16   Eyvonne Mechanic, PA-C  tamsulosin (FLOMAX) 0.4 MG CAPS capsule Take 1 capsule (0.4 mg total) by mouth daily. 01/09/16   Eyvonne Mechanic, PA-C    Family History Family History  Problem Relation Age of Onset  . Diabetes Mother   . Diabetes Father   . Heart disease Sister   . Diabetes Sister   . Diabetes Brother   . Diabetes Maternal Grandmother   . Diabetes Maternal Grandfather   . Diabetes Paternal Grandmother   . Diabetes Paternal Grandfather     Social History Social History  Substance Use Topics  .  Smoking status: Former Smoker    Quit date: 08/03/2001  . Smokeless tobacco: Never Used  . Alcohol use No     Allergies   Nsaids   Review of Systems Review of Systems  All other systems reviewed and are negative.    Physical Exam Updated Vital Signs BP 117/66 (BP Location: Left Arm)   Pulse 72   Temp 99 F (37.2 C) (Oral)   Resp 16   Wt 72.1 kg   SpO2 99%   BMI 30.04 kg/m   Physical Exam  Constitutional: She is oriented to person, place, and time. She appears well-developed and well-nourished.  HENT:  Head: Normocephalic and atraumatic.  Eyes: Conjunctivae are normal. Pupils are equal, round, and reactive to light. Right eye exhibits no discharge. Left eye exhibits no discharge. No scleral icterus.  Neck: Normal range of motion. No JVD present. No tracheal deviation present.  Pulmonary/Chest: Effort normal. No stridor.  Abdominal: Soft. She exhibits no distension. There is no tenderness.  Musculoskeletal:  No tenderness to the back or flank, no CVA tenderness  Neurological: She is alert and oriented to person, place, and time. Coordination normal.  Psychiatric: She has a normal mood and affect. Her behavior is normal. Judgment and thought content normal.  Nursing note and vitals reviewed.   ED Treatments / Results  Labs (all labs ordered are listed, but only abnormal results are displayed) Labs Reviewed  URINALYSIS, ROUTINE W REFLEX MICROSCOPIC - Abnormal; Notable for the following:       Result Value   APPearance HAZY (*)    Hgb urine dipstick MODERATE (*)    Protein, ur 30 (*)    Bacteria, UA MANY (*)    Squamous Epithelial / LPF 0-5 (*)    All other components within normal limits  COMPREHENSIVE METABOLIC PANEL - Abnormal; Notable for the following:    Glucose, Bld 167 (*)    Creatinine, Ser <0.30 (*)    All other components within normal limits  CBC - Abnormal; Notable for the following:    WBC 13.9 (*)    All other components within normal limits    LIPASE, BLOOD  I-STAT BETA HCG BLOOD, ED ([facility], [facility], [facility] ONLY)    EKG  EKG Interpretation None       Radiology Ct Abdomen Pelvis W Contrast  Result Date: 01/09/2016 CLINICAL DATA:  Three-week history of abdominal pain. Gastric bypass surgery in 2017 EXAM: CT ABDOMEN AND PELVIS WITH CONTRAST TECHNIQUE: Multidetector CT imaging of the abdomen and pelvis was performed using the standard protocol following bolus administration of intravenous contrast. CONTRAST:  ISOVUE-300 IOPAMIDOL (ISOVUE-300) INJECTION 61% COMPARISON:  None. FINDINGS: Lower chest: The lung bases are clear of acute process. No pleural effusion or pulmonary lesions. The heart is normal in size. No pericardial effusion. The distal esophagus and aorta are unremarkable. Hepatobiliary: A few tiny  scattered low-attenuation lesions are likely benign cysts. No worrisome hepatic lesions or intrahepatic biliary dilatation. The gallbladder is mildly distended. Suspect small layering gallstones. No common bile duct dilatation. Pancreas: No mass, inflammation or ductal dilatation. Spleen: Normal size.  No focal lesions. Adrenals/Urinary Tract: The adrenal glands are normal. The right kidney is normal. No renal calculi or obstructing ureteral calculi. The left kidney demonstrates hydronephrosis and slight decreased perfusion compared to the right kidney. There is moderate left-sided hydroureter down to an obstructing 6 mm calculus. No bladder calculi. No bladder mass. Stomach/Bowel: Surgical changes from gastric bypass surgery. No complicating features are demonstrated. The small bowel and colon are grossly normal without oral contrast. The terminal ileum and appendix are normal. Vascular/Lymphatic: Moderate mid distal aortic calcifications without focal aneurysm. Iliac artery calcifications are also noted bilaterally. The aortic branch vessels are patent. The major venous structures are patent. No mesenteric or retroperitoneal mass or adenopathy.  Reproductive: The uterus and ovaries are unremarkable. Other: No pelvic mass or adenopathy. No free pelvic fluid collections. No inguinal mass or adenopathy. No abdominal wall hernia or subcutaneous lesions. Musculoskeletal: No significant bony findings. IMPRESSION: 1. 6 mm distal left ureteral calculus causing moderate hydroureteronephrosis. 2. No renal calculi. 3. Surgical changes from gastric bypass surgery. No complicating features are demonstrated. 4. A few tiny scattered low-attenuation hepatic lesions, most likely benign cysts. 5. Age advanced three-vessel coronary artery calcifications, aortic and iliac artery calcifications. Electronically Signed   By: Rudie Meyer M.D.   On: 01/09/2016 16:38    Procedures Procedures (including critical care time)  Medications Ordered in ED Medications  tamsulosin (FLOMAX) capsule 0.4 mg (0.4 mg Oral Given 01/09/16 2057)  ibuprofen (ADVIL,MOTRIN) tablet 400 mg (400 mg Oral Given 01/09/16 1243)  iopamidol (ISOVUE-300) 61 % injection (100 mLs  Contrast Given 01/09/16 1621)  oxyCODONE-acetaminophen (PERCOCET/ROXICET) 5-325 MG per tablet 1 tablet (1 tablet Oral Given 01/09/16 2058)     Initial Impression / Assessment and Plan / ED Course  I have reviewed the triage vital signs and the nursing notes.  Pertinent labs & imaging results that were available during my care of the patient were reviewed by me and considered in my medical decision making (see chart for details).  Clinical Course     56 year old female presents today with likely ureteral calculus. Patient has been in the emergency room for approximately 8 hours prior to my evaluation, she had CT scan and laboratory analysis. She was given ibuprofen which has resolved her symptoms. She reports a vague flank pain at this point. Patient has no signs of infectious etiology, no signs of urinary tract infection. No other cute findings to require further evaluation or management here in the ED setting. Patient  will be given prescription for pain medication, Flomax, and urology follow-up. She is given strict return caution, she verbalized understanding and agreement to today's plan had no further questions or concerns  Final Clinical Impressions(s) / ED Diagnoses   Final diagnoses:  Ureterolithiasis    New Prescriptions Discharge Medication List as of 01/09/2016  8:33 PM       Eyvonne Mechanic, PA-C 01/09/16 2120    Rolan Bucco, MD 01/10/16 0025

## 2016-01-09 NOTE — ED Triage Notes (Signed)
Pt c/o L flank pain radiating into L abdomen and nausea x 3 days.  Pain score 10/10.  Pt reports recurrent pain since December.  Hx of constipation and sts relieves after BMs.  Hx of gastric bypass in August 2017.

## 2016-01-09 NOTE — Discharge Instructions (Signed)
Please read attached information. If you experience any new or worsening signs or symptoms please return to the emergency room for evaluation. Please follow-up with your primary care provider or specialist as discussed. Please use medication prescribed only as directed and discontinue taking if you have any concerning signs or symptoms.   °

## 2016-01-09 NOTE — ED Notes (Signed)
Patient verbalizes understanding to call Urology in am for F/U appointment

## 2016-01-10 ENCOUNTER — Other Ambulatory Visit: Payer: Self-pay | Admitting: Urology

## 2016-01-13 ENCOUNTER — Encounter (HOSPITAL_BASED_OUTPATIENT_CLINIC_OR_DEPARTMENT_OTHER): Payer: Self-pay | Admitting: *Deleted

## 2016-01-13 NOTE — Progress Notes (Signed)
NPO AFTER MN.  ARRIVE AT 1015.  CURRENT LAB RESULTS (CBC, CMET) AND EKG IN CHART AND EPIC.  WILL TAKE FLOMAX AM DOS W/ SIPS OF WATER AND IF NEEDED OXYCODONE.

## 2016-01-18 ENCOUNTER — Ambulatory Visit (HOSPITAL_BASED_OUTPATIENT_CLINIC_OR_DEPARTMENT_OTHER): Payer: BC Managed Care – PPO | Admitting: Anesthesiology

## 2016-01-18 ENCOUNTER — Ambulatory Visit (HOSPITAL_BASED_OUTPATIENT_CLINIC_OR_DEPARTMENT_OTHER)
Admission: RE | Admit: 2016-01-18 | Discharge: 2016-01-18 | Disposition: A | Discharge status: Home / Self Care | Payer: BC Managed Care – PPO | Source: Ambulatory Visit | Attending: Urology | Admitting: Urology

## 2016-01-18 ENCOUNTER — Encounter (HOSPITAL_BASED_OUTPATIENT_CLINIC_OR_DEPARTMENT_OTHER): Payer: Self-pay | Admitting: Anesthesiology

## 2016-01-18 ENCOUNTER — Encounter (HOSPITAL_BASED_OUTPATIENT_CLINIC_OR_DEPARTMENT_OTHER)
Admission: RE | Disposition: A | Discharge status: Home / Self Care | Payer: Self-pay | Source: Ambulatory Visit | Attending: Urology

## 2016-01-18 DIAGNOSIS — Z9884 Bariatric surgery status: Secondary | ICD-10-CM | POA: Insufficient documentation

## 2016-01-18 DIAGNOSIS — N132 Hydronephrosis with renal and ureteral calculous obstruction: Secondary | ICD-10-CM | POA: Diagnosis not present

## 2016-01-18 DIAGNOSIS — Z87891 Personal history of nicotine dependence: Secondary | ICD-10-CM | POA: Diagnosis not present

## 2016-01-18 DIAGNOSIS — E119 Type 2 diabetes mellitus without complications: Secondary | ICD-10-CM | POA: Insufficient documentation

## 2016-01-18 DIAGNOSIS — N201 Calculus of ureter: Secondary | ICD-10-CM | POA: Diagnosis present

## 2016-01-18 HISTORY — PX: CYSTOSCOPY/URETEROSCOPY/HOLMIUM LASER/STENT PLACEMENT: SHX6546

## 2016-01-18 HISTORY — DX: Diaphragmatic hernia without obstruction or gangrene: K44.9

## 2016-01-18 HISTORY — DX: Other constipation: K59.09

## 2016-01-18 HISTORY — PX: HOLMIUM LASER APPLICATION: SHX5852

## 2016-01-18 HISTORY — DX: Hyperlipidemia, unspecified: E78.5

## 2016-01-18 HISTORY — DX: Calculus of ureter: N20.1

## 2016-01-18 HISTORY — DX: Type 2 diabetes mellitus without complications: E11.9

## 2016-01-18 LAB — GLUCOSE, CAPILLARY
GLUCOSE-CAPILLARY: 121 mg/dL — AB (ref 65–99)
Glucose-Capillary: 135 mg/dL — ABNORMAL HIGH (ref 65–99)

## 2016-01-18 SURGERY — CYSTOSCOPY/URETEROSCOPY/HOLMIUM LASER/STENT PLACEMENT
Anesthesia: General | Site: Renal | Laterality: Left

## 2016-01-18 MED ORDER — HYDROMORPHONE HCL 1 MG/ML IJ SOLN
0.2500 mg | INTRAMUSCULAR | Status: DC | PRN
  Filled 2016-01-18: qty 0.5

## 2016-01-18 MED ORDER — LIDOCAINE 2% (20 MG/ML) 5 ML SYRINGE
INTRAMUSCULAR | Status: DC | PRN
  Administered 2016-01-18: 60 mg via INTRAVENOUS

## 2016-01-18 MED ORDER — PROPOFOL 10 MG/ML IV BOLUS
INTRAVENOUS | Status: AC
  Filled 2016-01-18: qty 40

## 2016-01-18 MED ORDER — LACTATED RINGERS IV SOLN
INTRAVENOUS | Status: DC | PRN
  Administered 2016-01-18: 10:00:00 via INTRAVENOUS

## 2016-01-18 MED ORDER — ONDANSETRON HCL 4 MG/2ML IJ SOLN
INTRAMUSCULAR | Status: AC
  Filled 2016-01-18: qty 2

## 2016-01-18 MED ORDER — SODIUM CHLORIDE 0.9 % IR SOLN
Status: DC | PRN
  Administered 2016-01-18: 4000 mL

## 2016-01-18 MED ORDER — KETOROLAC TROMETHAMINE 30 MG/ML IJ SOLN
INTRAMUSCULAR | Status: AC
  Filled 2016-01-18: qty 1

## 2016-01-18 MED ORDER — ACETAMINOPHEN 10 MG/ML IV SOLN
INTRAVENOUS | Status: AC
  Filled 2016-01-18: qty 100

## 2016-01-18 MED ORDER — PROMETHAZINE HCL 25 MG/ML IJ SOLN
6.2500 mg | INTRAMUSCULAR | Status: DC | PRN
  Filled 2016-01-18: qty 1

## 2016-01-18 MED ORDER — ONDANSETRON HCL 4 MG/2ML IJ SOLN
INTRAMUSCULAR | Status: DC | PRN
  Administered 2016-01-18: 4 mg via INTRAVENOUS

## 2016-01-18 MED ORDER — LIDOCAINE 2% (20 MG/ML) 5 ML SYRINGE
INTRAMUSCULAR | Status: AC
  Filled 2016-01-18: qty 5

## 2016-01-18 MED ORDER — DEXTROSE 5 % IV SOLN
5.0000 mg/kg | INTRAVENOUS | Status: AC
  Administered 2016-01-18: 290 mg via INTRAVENOUS
  Filled 2016-01-18 (×2): qty 7.25

## 2016-01-18 MED ORDER — PROPOFOL 10 MG/ML IV BOLUS
INTRAVENOUS | Status: DC | PRN
  Administered 2016-01-18: 200 mg via INTRAVENOUS

## 2016-01-18 MED ORDER — FENTANYL CITRATE (PF) 100 MCG/2ML IJ SOLN
INTRAMUSCULAR | Status: AC
  Filled 2016-01-18: qty 2

## 2016-01-18 MED ORDER — DEXAMETHASONE SODIUM PHOSPHATE 10 MG/ML IJ SOLN
INTRAMUSCULAR | Status: DC | PRN
  Administered 2016-01-18: 10 mg via INTRAVENOUS

## 2016-01-18 MED ORDER — IOHEXOL 300 MG/ML  SOLN
INTRAMUSCULAR | Status: DC | PRN
Start: 2016-01-18 — End: 2016-01-18
  Administered 2016-01-18: 8 mL

## 2016-01-18 MED ORDER — GENTAMICIN IN SALINE 1.6-0.9 MG/ML-% IV SOLN
80.0000 mg | INTRAVENOUS | Status: DC
  Filled 2016-01-18: qty 50

## 2016-01-18 MED ORDER — MIDAZOLAM HCL 2 MG/2ML IJ SOLN
INTRAMUSCULAR | Status: AC
  Filled 2016-01-18: qty 2

## 2016-01-18 MED ORDER — MIDAZOLAM HCL 5 MG/5ML IJ SOLN
INTRAMUSCULAR | Status: DC | PRN
  Administered 2016-01-18: 2 mg via INTRAVENOUS

## 2016-01-18 MED ORDER — OXYCODONE-ACETAMINOPHEN 5-325 MG PO TABS: 1.0000 | ORAL_TABLET | Freq: Four times a day (QID) | ORAL | 0 refills | Status: DC | PRN

## 2016-01-18 MED ORDER — ARTIFICIAL TEARS OP OINT
TOPICAL_OINTMENT | OPHTHALMIC | Status: AC
  Filled 2016-01-18: qty 7

## 2016-01-18 MED ORDER — CEPHALEXIN 500 MG PO CAPS: 500.0000 mg | ORAL_CAPSULE | Freq: Two times a day (BID) | ORAL | 0 refills | Status: DC

## 2016-01-18 MED ORDER — FENTANYL CITRATE (PF) 100 MCG/2ML IJ SOLN
INTRAMUSCULAR | Status: DC | PRN
  Administered 2016-01-18: 50 ug via INTRAVENOUS

## 2016-01-18 MED ORDER — LACTATED RINGERS IV SOLN
INTRAVENOUS | Status: DC
  Administered 2016-01-18: 10:00:00 via INTRAVENOUS
  Filled 2016-01-18: qty 1000

## 2016-01-18 MED ORDER — DEXAMETHASONE SODIUM PHOSPHATE 10 MG/ML IJ SOLN
INTRAMUSCULAR | Status: AC
  Filled 2016-01-18: qty 1

## 2016-01-18 MED ORDER — ACETAMINOPHEN 10 MG/ML IV SOLN
INTRAVENOUS | Status: DC | PRN
  Administered 2016-01-18: 1000 mg via INTRAVENOUS

## 2016-01-18 SURGICAL SUPPLY — 27 items
BAG DRAIN URO-CYSTO SKYTR STRL (DRAIN) ×3 IMPLANT
BASKET DAKOTA 1.9FR 11X120 (BASKET) IMPLANT
BASKET LASER NITINOL 1.9FR (BASKET) ×3 IMPLANT
BASKET ZERO TIP NITINOL 2.4FR (BASKET) IMPLANT
CATH INTERMIT  6FR 70CM (CATHETERS) ×3 IMPLANT
CLOTH BEACON ORANGE TIMEOUT ST (SAFETY) ×3 IMPLANT
FIBER LASER FLEXIVA 365 (UROLOGICAL SUPPLIES) IMPLANT
FIBER LASER TRAC TIP (UROLOGICAL SUPPLIES) ×3 IMPLANT
GLOVE BIO SURGEON STRL SZ7.5 (GLOVE) ×3 IMPLANT
GLOVE BIOGEL PI IND STRL 7.5 (GLOVE) ×2 IMPLANT
GLOVE BIOGEL PI INDICATOR 7.5 (GLOVE) ×4
GOWN STRL REUS W/ TWL LRG LVL3 (GOWN DISPOSABLE) IMPLANT
GOWN STRL REUS W/TWL LRG LVL3 (GOWN DISPOSABLE) ×6 IMPLANT
GUIDEWIRE ANG ZIPWIRE 038X150 (WIRE) ×3 IMPLANT
GUIDEWIRE STR DUAL SENSOR (WIRE) ×3 IMPLANT
IV NS 1000ML (IV SOLUTION) ×2
IV NS 1000ML BAXH (IV SOLUTION) ×1 IMPLANT
IV NS IRRIG 3000ML ARTHROMATIC (IV SOLUTION) ×3 IMPLANT
KIT ROOM TURNOVER WOR (KITS) ×3 IMPLANT
MANIFOLD NEPTUNE II (INSTRUMENTS) ×3 IMPLANT
NS IRRIG 500ML POUR BTL (IV SOLUTION) ×3 IMPLANT
PACK CYSTO (CUSTOM PROCEDURE TRAY) ×3 IMPLANT
STENT POLARIS 5FRX22 (STENTS) ×3 IMPLANT
SYRINGE 10CC LL (SYRINGE) ×3 IMPLANT
TUBE CONNECTING 12'X1/4 (SUCTIONS)
TUBE CONNECTING 12X1/4 (SUCTIONS) IMPLANT
TUBE FEEDING 8FR 16IN STR KANG (MISCELLANEOUS) ×3 IMPLANT

## 2016-01-18 NOTE — Discharge Instructions (Signed)
1 - You may have urinary urgency (bladder spasms) and bloody urine on / off with stent in place. This is normal.  2 - Remove tethered stent on Friday morning by pulling on string, then blue-white plastic tubing, and discarding. Office is open Friday if any issues arise.   3 - Call MD or go to ER for fever >102, severe pain / nausea / vomiting not relieved by medications, or acute change in medical status   Post Anesthesia Home Care Instructions  Activity: Get plenty of rest for the remainder of the day. A responsible adult should stay with you for 24 hours following the procedure.  For the next 24 hours, DO NOT: -Drive a car -Advertising copywriter -Drink alcoholic beverages -Take any medication unless instructed by your physician -Make any legal decisions or sign important papers.  Meals: Start with liquid foods such as gelatin or soup. Progress to regular foods as tolerated. Avoid greasy, spicy, heavy foods. If nausea and/or vomiting occur, drink only clear liquids until the nausea and/or vomiting subsides. Call your physician if vomiting continues.  Special Instructions/Symptoms: Your throat may feel dry or sore from the anesthesia or the breathing tube placed in your throat during surgery. If this causes discomfort, gargle with warm salt water. The discomfort should disappear within 24 hours.  If you had a scopolamine patch placed behind your ear for the management of post- operative nausea and/or vomiting:  1. The medication in the patch is effective for 72 hours, after which it should be removed.  Wrap patch in a tissue and discard in the trash. Wash hands thoroughly with soap and water. 2. You may remove the patch earlier than 72 hours if you experience unpleasant side effects which may include dry mouth, dizziness or visual disturbances. 3. Avoid touching the patch. Wash your hands with soap and water after contact with the patch.   [facility] [phone] Post  Ureteroscopy With or Without Stent Instructions  Definitions:  Ureter: The duct that transports urine from the kidney to the bladder. Stent:   A plastic hollow tube that is placed into the ureter, from the kidney to the                 bladder to prevent the ureter from swelling shut.  GENERAL INSTRUCTIONS:  Despite the fact that no skin incisions were used, the area around the ureter and bladder is raw and irritated. The stent is a foreign body which will further irritate the bladder wall. This irritation is manifested by increased frequency of urination, both day and night, and by an increase in the urge to urinate. In some, the urge to urinate is present almost always. Sometimes the urge is strong enough that you may not be able to stop yourself from urinating. The only real cure is to remove the stent and then give time for the bladder wall to heal which can't be done until the danger of the ureter swelling shut has passed, which varies.  You may see some blood in your urine while the stent is in place and a few days afterwards. Do not be alarmed, even if the urine was clear for a while. Get off your feet and drink lots of fluids until clearing occurs. If you start to pass clots or don't improve, call us.  DIET: You may return to your normal diet immediately. Because of the raw surface of your bladder, alcohol, spicy foods, acid type foods and drinks with caffeine may cause  irritation or frequency and should be used in moderation. To keep your urine flowing freely and to avoid constipation, drink plenty of fluids during the day ( 8-10 glasses ). Tip: Avoid cranberry juice because it is very acidic.  ACTIVITY: Your physical activity doesn't need to be restricted. However, if you are very active, you may see some blood in your urine. We suggest that you reduce your activity under these circumstances until the bleeding has stopped.  BOWELS: It is important to keep your bowels regular during the  postoperative period. Straining with bowel movements can cause bleeding. A bowel movement every other day is reasonable. Use a mild laxative if needed, such as Milk of Magnesia 2-3 tablespoons, or 2 Dulcolax tablets. Call if you continue to have problems. If you have been taking narcotics for pain, before, during or after your surgery, you may be constipated. Take a laxative if necessary.   MEDICATION: You should resume your pre-surgery medications unless told not to. In addition you will often be given an antibiotic to prevent infection. These should be taken as prescribed until the bottles are finished unless you are having an unusual reaction to one of the drugs.  PROBLEMS YOU SHOULD REPORT TO US:  Fevers over 100.5 Fahrenheit.  Heavy bleeding, or clots ( See above notes about blood in urine ).  Inability to urinate.  Drug reactions ( hives, rash, nausea, vomiting, diarrhea ).  Severe burning or pain with urination that is not improving.  FOLLOW-UP: You will need a follow-up appointment to monitor your progress. Call for this appointment at the number listed above. Usually the first appointment will be about three to fourteen days after your surgery.

## 2016-01-18 NOTE — H&P (Signed)
Lindsay [name] is an 56 y.o. female.    Chief Complaint: Pre-op LEFT ureteroscopic stone manipulation  HPI:   1 - LEFT ureteral stone - 7mm left distal stone by ER CT on eval acute flank pain. Stone is solitary. UA without infectious parameters. Her first stone occurred after R+Y gastric bypass.  Today "Lindsay [name]" is seen to proceed with LEFT ureteroscopic stone manipulation. No interval fevers.   Past Medical History:  Diagnosis Date  . Arthritis    hands,elbows,hips, back, shoulders  . Chronic constipation   . Depression   . Hiatal hernia   . Hyperlipidemia   . Left ureteral stone   . Type 2 diabetes, diet controlled (HCC)    hx iddm-- off insulin since gastric bypass 08/ 2017    Past Surgical History:  Procedure Laterality Date  . ANTERIOR FUSION CERVICAL SPINE  [facility]   [facility] [place] [facility] dr Lovell Sheehan   C4 -- C6  . CARDIOVASCULAR STRESS TEST  07/18/2015   Low risk nuclear study w/ reverisible small defect of mild severity in apical lateral region, although likely represents breast attenuation artifact , cannnot rule out subtle area of ishemia in distal lateral wall/  normal LV function and wall motion , ef 64%  . CARPAL TUNNEL RELEASE Bilateral right 12/ 2017/  left 02/ 2017  . ESOPHAGEAL MANOMETRY N/A 06/27/2015   Procedure: ESOPHAGEAL MANOMETRY (EM);  Surgeon: Napoleon Form, MD;  Location: [facility];  Service: Endoscopy;  Laterality: N/A;  . EYE SURGERY Right 02/2015   "remove oil behind eye"  . LAPAROSCOPIC ROUX-EN-Y GASTRIC BYPASS WITH HIATAL HERNIA REPAIR N/A 08/15/2015   Procedure: LAPAROSCOPIC ROUX-EN-Y GASTRIC BYPASS WITH HIATAL HERNIA REPAIR, UPPER ENDO;  Surgeon: De Blanch Kinsinger, MD;  Location: [facility];  Service: General;  Laterality: N/A;  . 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 Diabetes Mother   . Diabetes Father   . Heart disease Sister    . Diabetes Sister   . Diabetes Brother   . Diabetes Maternal Grandmother   . Diabetes Maternal Grandfather   . Diabetes Paternal Grandmother   . Diabetes Paternal Grandfather    Social History:  reports that she quit smoking about 14 years ago. Her smoking use included Cigarettes. She quit after 27.00 years of use. She has never used smokeless tobacco. She reports that she does not drink alcohol or use drugs.  Allergies:  Allergies  Allergen Reactions  . Nsaids Other (See Comments)    Avoids , hx ulcer and gastric bypass    No prescriptions prior to admission.    No results found for this or any previous visit (from the past 48 hour(s)). No results found.  Review of Systems  Constitutional: Negative.  Negative for chills and fever.  HENT: Negative.   Eyes: Negative.   Respiratory: Negative.   Cardiovascular: Negative.   Gastrointestinal: Positive for nausea.  Genitourinary: Positive for flank pain, frequency and urgency.  Skin: Negative.   Neurological: Negative.   Endo/Heme/Allergies: Negative.   Psychiatric/Behavioral: Negative.     Height 5\' 1"  (1.549 m), weight 70.8 kg (156 lb). Physical Exam  Constitutional: She is oriented to person, place, and time. She appears well-developed.  HENT:  Head: Normocephalic.  Eyes: Pupils are equal, round, and reactive to light.  Neck: Normal range of motion.  Cardiovascular: Normal rate.   Respiratory: Effort normal.  GI: Soft.  Stigmata of weight loss.   Genitourinary:  Genitourinary Comments: Mild left CVAT at present.   Musculoskeletal: Normal range of motion.  Neurological: She is alert and oriented to person, place, and time.  Skin: Skin is warm.  Psychiatric: She has a normal mood and affect. Her behavior is normal. Thought content normal.     Assessment/Plan  1 - LEFT ureteral stone - proceed as planned today with LEFT ureteroscopic stone manipulation. Risks, benefits, alternatives, expected peri-op course, need  for possible temporary ureteral stents, need for possible staged approach discussed previously and reiterated today.    Sebastian AcheMANNY, Usha Slager, MD 01/18/2016, 7:40 AM

## 2016-01-18 NOTE — Anesthesia Procedure Notes (Signed)
Procedure Name: LMA Insertion Date/Time: 01/18/2016 10:14 AM Performed by: Tyrone NineSAUVE, Dessa Ledee F Pre-anesthesia Checklist: Patient identified, Timeout performed, Emergency Drugs available, Patient being monitored and Suction available Patient Re-evaluated:Patient Re-evaluated prior to inductionOxygen Delivery Method: Circle system utilized Preoxygenation: Pre-oxygenation with 100% oxygen Intubation Type: IV induction Ventilation: Mask ventilation without difficulty LMA: LMA inserted LMA Size: 4.0 Number of attempts: 1 Placement Confirmation: positive ETCO2 and breath sounds checked- equal and bilateral Tube secured with: Tape Dental Injury: Teeth and Oropharynx as per pre-operative assessment

## 2016-01-18 NOTE — Transfer of Care (Signed)
Immediate Anesthesia Transfer of Care Note  Patient: Lindsay [name]  Procedure(s) Performed: Procedure(s): CYSTOSCOPY/URETEROSCOPY/HOLMIUM LASER/STENT PLACEMENT (Left) HOLMIUM LASER APPLICATION (Left)  Patient Location: PACU  Anesthesia Type:General  Level of Consciousness: awake, alert , oriented and patient cooperative  Airway & Oxygen Therapy: Patient Spontanous Breathing and Patient connected to nasal cannula oxygen  Post-op Assessment: Report given to RN and Post -op Vital signs reviewed and stable  Post vital signs: Reviewed and stable  Last Vitals:  Vitals:   01/18/16 0928  BP: (!) 156/61  Pulse: 64  Resp: 16  Temp: 36.5 C    Last Pain:  Vitals:   01/18/16 0928  TempSrc: Oral      Patients Stated Pain Goal: 7 (01/18/16 0939)  Complications: No apparent anesthesia complications

## 2016-01-18 NOTE — Brief Op Note (Signed)
01/18/2016  10:53 AM  PATIENT:  Buckner Maltaristina Feild  56 y.o. female  PRE-OPERATIVE DIAGNOSIS:  LEFT URETERAL STONE  POST-OPERATIVE DIAGNOSIS:  LEFT URETERAL STONE  PROCEDURE:  Procedure(s): CYSTOSCOPY/URETEROSCOPY/HOLMIUM LASER/STENT PLACEMENT (Left) HOLMIUM LASER APPLICATION (Left)  SURGEON:  Surgeon(s) and Role:    * Sebastian Acheheodore Gared Gillie, MD - Primary  PHYSICIAN ASSISTANT:   ASSISTANTS: none   ANESTHESIA:   general  EBL:  Total I/O In: -  Out: 5 [Blood:5]  BLOOD ADMINISTERED:none  DRAINS: none   LOCAL MEDICATIONS USED:  NONE  SPECIMEN:  Source of Specimen:  left ureteral stone  DISPOSITION OF SPECIMEN:  [facility] for compositional analysis  COUNTS:  YES  TOURNIQUET:  * No tourniquets in log *  DICTATION: .Other Dictation: Dictation Number [id number]  PLAN OF CARE: Discharge to home after PACU  PATIENT DISPOSITION:  PACU - hemodynamically stable.   Delay start of Pharmacological VTE agent (>24hrs) due to surgical blood loss or risk of bleeding: yes

## 2016-01-18 NOTE — Anesthesia Postprocedure Evaluation (Addendum)
Anesthesia Post Note  Patient: Lindsay [name]  Procedure(s) Performed: Procedure(s) (LRB): CYSTOSCOPY/URETEROSCOPY/HOLMIUM LASER/STENT PLACEMENT (Left) HOLMIUM LASER APPLICATION (Left)  Patient location during evaluation: PACU Anesthesia Type: General Level of consciousness: awake and alert Pain management: pain level controlled Vital Signs Assessment: post-procedure vital signs reviewed and stable Respiratory status: spontaneous breathing, nonlabored ventilation, respiratory function stable and patient connected to nasal cannula oxygen Cardiovascular status: blood pressure returned to baseline and stable Postop Assessment: no signs of nausea or vomiting Anesthetic complications: no       Last Vitals:  Vitals:   01/18/16 1115 01/18/16 1130  BP: (!) 149/56 (!) 148/70  Pulse: 61 (!) 58  Resp: 11 12  Temp:      Last Pain:  Vitals:   01/18/16 0928  TempSrc: Oral                 Paislei Dorval,JAMES TERRILL

## 2016-01-18 NOTE — Anesthesia Preprocedure Evaluation (Signed)
Anesthesia Evaluation  Patient identified by MRN, date of birth, ID band Patient awake    Reviewed: Allergy & Precautions, NPO status   Airway Mallampati: I  TM Distance: >3 FB Neck ROM: Full    Dental  (+) Teeth Intact   Pulmonary former smoker,    breath sounds clear to auscultation       Cardiovascular  Rhythm:Regular Rate:Normal     Neuro/Psych  Neuromuscular disease    GI/Hepatic negative GI ROS, Neg liver ROS,   Endo/Other  diabetes  Renal/GU negative Renal ROS     Musculoskeletal  (+) Arthritis ,   Abdominal   Peds  Hematology negative hematology ROS (+)   Anesthesia Other Findings   Reproductive/Obstetrics                             Anesthesia Physical Anesthesia Plan  ASA: II  Anesthesia Plan: General   Post-op Pain Management:    Induction: Intravenous  Airway Management Planned: LMA  Additional Equipment:   Intra-op Plan:   Post-operative Plan: Extubation in OR  Informed Consent: I have reviewed the patients History and Physical, chart, labs and discussed the procedure including the risks, benefits and alternatives for the proposed anesthesia with the patient or authorized representative who has indicated his/her understanding and acceptance.   Dental advisory given  Plan Discussed with:   Anesthesia Plan Comments:         Anesthesia Quick Evaluation

## 2016-01-19 ENCOUNTER — Encounter (HOSPITAL_BASED_OUTPATIENT_CLINIC_OR_DEPARTMENT_OTHER): Payer: Self-pay | Admitting: Urology

## 2016-01-19 NOTE — Op Note (Signed)
[name], [name]              ACCOUNT NO.:  [id number]  MEDICAL RECORD NO.:  [id number]  LOCATION:                                 FACILITY:  PHYSICIAN:  Lindsay Ache, MD     DATE OF BIRTH:  07-31-1960  DATE OF PROCEDURE: 01/18/2016                              OPERATIVE REPORT   DIAGNOSIS:  Left ureteral stone with refractory colic.  PROCEDURES: 1. Cystoscopy with left retrograde pyelogram and interpretation. 2. Left ureteroscopy with laser lithotripsy. 3. Insertion of left ureteral stent, 5 x 22 Polaris with tether.  ESTIMATED BLOOD LOSS:  Nil.  COMPLICATION:  None.  SPECIMEN:  Left ureteral stone fragments for compositional analysis.  FINDINGS: 1. Impacted left distal 4th ureteral stone. 2. Complete resolution of all stone fragments larger than 1/3rd mm     following laser lithotripsy and basket extraction of the entire     length of left ureter. 3. Successful placement of left ureteral stent, proximal end in renal     pelvis and distal end in urinary bladder.  INDICATION:  Lindsay [name] is a pleasant 56 year old lady with relatively recent history of gastric bypass surgery.  No prior history of nephrolithiasis proceeding that, who presented with acute onset of left colicky flank pain and was found by axial imaging to have a left 7-mm ureteral stone that was solitary.  She had no infectious parameters. She underwent a brief trial of medical therapy; however, symptoms remained quite problematic.  Options were discussed for further management including continued medical therapy versus shockwave lithotripsy versus ureteroscopy, and she wished to proceed with the latter.  Informed consent was obtained and placed in the medical record.  PROCEDURE IN DETAIL:  The patient being Lindsay [name], was verified. Procedure being left ureteroscopic stone manipulation was confirmed. Procedure was carried out.  Time-out was performed.  Intravenous antibiotics were administered.   General anesthesia was introduced.  The patient was placed into a low lithotomy position and sterile field was created by prepping and draping the patient's vagina, introitus and proximal thighs using iodine.  Next, cystourethroscopy was performed using a rigid cystoscope with offset lens.  Inspection of the urinary bladder revealed no diverticula, calcifications, papillary lesions. Ureteral orifices appeared singleton bilaterally.  The left ureteral orifice was cannulated with a 6-French end-hole catheter and left retrograde pyelogram was obtained.  Left retrograde pyelogram demonstrated a single left ureter with single- system left kidney.  There was mild hydroureteronephrosis to a filling defect in the distal 4th of the ureter consistent with known stone.  A 0.038 Zip wire was advanced to the level of the upper pole, set aside as a safety wire.  An 8-French feeding tube placed in the urinary bladder for pressure release and semi-rigid ureteroscopy was performed to the distal left ureter alongside a separate Sensor working wire.  As expected, the stone in question was encountered in the distal 4th of the ureter.  It was somewhat impacted with some moderate mucosal edema in the area, it did appear to be much too large for simple basketing.  As such, holmium laser energy was applied to the stone using settings of 0.3 joules and 30 hertz.  This was fragmented  in approximately 4 smaller pieces that were then sequentially grasped on their long axis, removed and set aside for compositional analysis.  Following these maneuvers, there was additional complete inspection of the left ureter and no additional calcifications were noted whatsoever.  Given the moderate mucosal edema at the site of stone impaction, it was felt that brief interval stenting would be warranted.  As such, a new 5 x 22 Polaris- type stent was placed using a fluoroscopic guidance.  Good proximal and distal deployment were  noted.  A tether was left in place and fashioned to the mons pubis and the procedure was terminated.  The patient tolerated the procedure well.  There were no immediate periprocedural complications.  The patient was taken to the postanesthesia care unit in stable condition.    ______________________________ Lindsay Acheheodore Markee Matera, MD   ______________________________ Lindsay Acheheodore Sharri Loya, MD    TM/MEDQ  D:  01/18/2016  T:  01/19/2016  Job:  [id number]

## 2016-02-21 ENCOUNTER — Ambulatory Visit: Payer: BC Managed Care – PPO | Admitting: Dietician

## 2016-02-23 ENCOUNTER — Ambulatory Visit: Payer: BC Managed Care – PPO | Admitting: Skilled Nursing Facility1

## 2016-06-01 NOTE — Addendum Note (Signed)
Addendum  created 06/01/16 0935 by Sharee HolsterMassagee, Wilbur [place], MD   Sign clinical note

## 2017-03-13 IMAGING — NM NM MISC PROCEDURE
3 series · 18 of 18 positions shown · non-contrast
Comparison: none

[Series 1: rest_(id)_sa · 6.4mm · 6.40mm/px · 6 of 64 frames shown]
[frame 6/64]
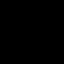
[frame 16/64]
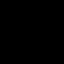
[frame 27/64]
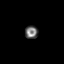
[frame 38/64]
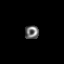
[frame 48/64]
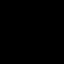
[frame 59/64]
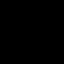

[Series 1: stress-sum-em_(id)_sa · 6.4mm · 6.40mm/px · 6 of 64 frames shown]
[frame 6/64]
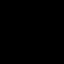
[frame 16/64]
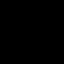
[frame 27/64]
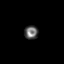
[frame 38/64]
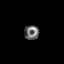
[frame 48/64]
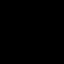
[frame 59/64]
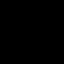

[Series 1: stress-gsp_(id)_sa · 6.4mm · 6.40mm/px · 6 of 512 frames shown]
[frame 43/512]
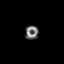
[frame 128/512]
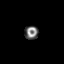
[frame 214/512]
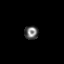
[frame 299/512]
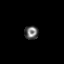
[frame 384/512]
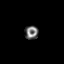
[frame 470/512]
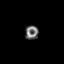

[18 of 18 positions shown; findings below may reference images not displayed]

Canned report from images found in remote index.

Refer to host system for actual result text.

## 2018-09-26 ENCOUNTER — Telehealth: Payer: Self-pay | Admitting: Nurse Practitioner

## 2018-09-26 NOTE — Telephone Encounter (Signed)
Left message for patient regarding her appointment on 9/30 with Dr. Acie Fredrickson. I advised her to call back to confirm virtual appointment or to reschedule for clinic appointment.

## 2018-10-01 ENCOUNTER — Other Ambulatory Visit: Payer: Self-pay

## 2018-10-01 ENCOUNTER — Telehealth (INDEPENDENT_AMBULATORY_CARE_PROVIDER_SITE_OTHER): Payer: Managed Care, Other (non HMO) | Admitting: Cardiovascular Disease

## 2018-10-01 VITALS — Ht 61.0 in | Wt 152.0 lb

## 2018-10-01 DIAGNOSIS — E785 Hyperlipidemia, unspecified: Secondary | ICD-10-CM

## 2018-10-01 NOTE — Telephone Encounter (Signed)
Virtual Visit Pre-Appointment Phone Call  1. Confirm consent - "In the setting of the current Covid19 crisis, you are scheduled for a (phone or video) visit with your provider on (date) at (time).  Just as we do with many in-office visits, in order for you to participate in this visit, we must obtain consent.  If you'd like, I can send this to your mychart (if signed up) or email for you to review.  Otherwise, I can obtain your verbal consent now.  All virtual visits are billed to your insurance company just like a normal visit would be.  By agreeing to a virtual visit, we'd like you to understand that the technology does not allow for your provider to perform an examination, and thus may limit your provider's ability to fully assess your condition. If your provider identifies any concerns that need to be evaluated in person, we will make arrangements to do so.  Finally, though the technology is pretty good, we cannot assure that it will always work on either your or our end, and in the setting of a video visit, we may have to convert it to a phone-only visit.  In either situation, we cannot ensure that we have a secure connection.  Are you willing to proceed?" STAFF: Did the patient verbally acknowledge consent to telehealth visit? Document YES/NO here: YES    TELEPHONE CALL NOTE  Lindsay [name] has been deemed a candidate for a follow-up tele-health visit to limit community exposure during the Covid-19 pandemic. I spoke with the patient via phone to ensure availability of phone/video source, confirm preferred email & phone number, and discuss instructions and expectations.  I reminded Lindsay [name] to be prepared with any vital sign and/or heart rhythm information that could potentially be obtained via home monitoring, at the time of her visit. I reminded Lindsay [name] to expect a phone call prior to her visit.   IF USING DOXIMITY or DOXY.ME - The patient will receive a link just prior to their  visit by text.     FULL LENGTH CONSENT FOR TELE-HEALTH VISIT   I hereby voluntarily request, consent and authorize [facility] and its employed or contracted physicians, physician assistants, nurse practitioners or other licensed health care professionals (the Practitioner), to provide me with telemedicine health care services (the "Services") as deemed necessary by the treating Practitioner. I acknowledge and consent to receive the Services by the Practitioner via telemedicine. I understand that the telemedicine visit will involve communicating with the Practitioner through live audiovisual communication technology and the disclosure of certain medical information by electronic transmission. I acknowledge that I have been given the opportunity to request an in-person assessment or other available alternative prior to the telemedicine visit and am voluntarily participating in the telemedicine visit.  I understand that I have the right to withhold or withdraw my consent to the use of telemedicine in the course of my care at any time, without affecting my right to future care or treatment, and that the Practitioner or I may terminate the telemedicine visit at any time. I understand that I have the right to inspect all information obtained and/or recorded in the course of the telemedicine visit and may receive copies of available information for a reasonable fee.  I understand that some of the potential risks of receiving the Services via telemedicine include:  Marland Kitchen Delay or interruption in medical evaluation due to technological equipment failure or disruption; . Information transmitted may not be sufficient (e.g. poor  resolution of images) to allow for appropriate medical decision making by the Practitioner; and/or  . In rare instances, security protocols could fail, causing a breach of personal health information.  Furthermore, I acknowledge that it is my responsibility to provide information about my  medical history, conditions and care that is complete and accurate to the best of my ability. I acknowledge that Practitioner's advice, recommendations, and/or decision may be based on factors not within their control, such as incomplete or inaccurate data provided by me or distortions of diagnostic images or specimens that may result from electronic transmissions. I understand that the practice of medicine is not an exact science and that Practitioner makes no warranties or guarantees regarding treatment outcomes. I acknowledge that I will receive a copy of this consent concurrently upon execution via email to the email address I last provided but may also request a printed copy by calling the office of [facility].    I understand that my insurance will be billed for this visit.   I have read or had this consent read to me. . I understand the contents of this consent, which adequately explains the benefits and risks of the Services being provided via telemedicine.  . I have been provided ample opportunity to ask questions regarding this consent and the Services and have had my questions answered to my satisfaction. . I give my informed consent for the services to be provided through the use of telemedicine in my medical care

## 2018-10-01 NOTE — Progress Notes (Signed)
Virtual Visit via Telephone Note   This visit type was conducted due to national recommendations for restrictions regarding the COVID-19 Pandemic (e.g. social distancing) in an effort to limit this patient's exposure and mitigate transmission in our community.  Due to her co-morbid illnesses, this patient is at least at moderate risk for complications without adequate follow up.  This format is felt to be most appropriate for this patient at this time.  The patient did not have access to video technology/had technical difficulties with video requiring transitioning to audio format only (telephone).  All issues noted in this document were discussed and addressed.  No physical exam could be performed with this format.  Please refer to the patient's chart for her  consent to telehealth for [facility].   Date:  10/01/2018   ID:  Lindsay [name], DOB [birth year], MRN [id number]  Patient Location: Home Provider Location: Home  PCP:  Verlon Au, MD  Cardiologist:  Nahser  Electrophysiologist:  None    Problem List 1. Diabetes Mellitus, diabetic retinopathy 2.  Hyperlipidemia     History of Present Illness: Lindsay [name] is a 58 y.o. female who presents for  Evaluation for the possibility of coronary artery disease. She has a history of diabetes mellitus for many years. She's fairly insulin resistant. She has a diabetic retinopathy. She also has a history of hyperlipidemia.   She has a strong family history of coronary artery disease.  multiple members of her family have had CABG Does not exercise but is very active at work .  She recently had surgery on her neck  - which limits her activity   Evaluation Performed:  New Patient Evaluation  Chief Complaint:  Hyperlipidemia   Sept. 30, 2020     [name] is a 58 y.o. female with hx of DM, hyperlipidemia,  Obesity Has had gastric bypass  Exercising regularly 5-6 days a week.  Has lost 60 -70  lbs after gastric  bypass  Diabetes is much better controlled. HbA1C is around 7 , down from 10   Had labwork in Sept.   With Dr. Talmage Nap. She will have labs faxed in   Walks [address] - 15,000 steps a day  Walks 15 minuetes a day , then works at the [employer] very healthy diet  No cp or dyspnea.   Cannot check VS today but her VS a month ago looked    The patient does not have symptoms concerning for COVID-19 infection (fever, chills, cough, or new shortness of breath).    Past Medical History:  Diagnosis Date  . Arthritis    hands,elbows,hips, back, shoulders  . Chronic constipation   . Depression   . Hiatal hernia   . Hyperlipidemia   . Left ureteral stone   . Type 2 diabetes, diet controlled (HCC)    hx iddm-- off insulin since gastric bypass 08/ 2017   Past Surgical History:  Procedure Laterality Date  . ANTERIOR FUSION CERVICAL SPINE  [facility]   [facility] [place] [facility] Lovell Sheehan   C4 -- C6  . CARDIOVASCULAR STRESS TEST  07/18/2015   Low risk nuclear study w/ reverisible small defect of mild severity in apical lateral region, although likely represents breast attenuation artifact , cannnot rule out subtle area of ishemia in distal lateral wall/  normal LV function and wall motion , ef 64%  . CARPAL TUNNEL RELEASE Bilateral right 12/ 2017/  left 02/ 2017  . CYSTOSCOPY/URETEROSCOPY/HOLMIUM LASER/STENT PLACEMENT  Left 01/18/2016   Procedure: CYSTOSCOPY/URETEROSCOPY/HOLMIUM LASER/STENT PLACEMENT;  Surgeon: Sebastian Acheheodore Manny, MD;  [facility];  Service: Urology;  Laterality: Left;  . ESOPHAGEAL MANOMETRY N/A 06/27/2015   Procedure: ESOPHAGEAL MANOMETRY (EM);  Surgeon: Napoleon FormKavitha V Nandigam, MD;  Location: [facility];  Service: Endoscopy;  Laterality: N/A;  . EYE SURGERY Right 02/2015   "remove oil behind eye"  . HOLMIUM LASER APPLICATION Left 01/18/2016   Procedure: HOLMIUM LASER APPLICATION;  Surgeon: Sebastian Acheheodore Manny, MD;  [facility];   Service: Urology;  Laterality: Left;  . LAPAROSCOPIC ROUX-EN-Y GASTRIC BYPASS WITH HIATAL HERNIA REPAIR N/A 08/15/2015   Procedure: LAPAROSCOPIC ROUX-EN-Y GASTRIC BYPASS WITH HIATAL HERNIA REPAIR, UPPER ENDO;  Surgeon: De BlanchLuke Aaron Kinsinger, MD;  Location: [facility];  Service: General;  Laterality: N/A;  . 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 Biotin 5000 MCG CAPS Take 1 capsule by mouth daily.  . cyclobenzaprine (FLEXERIL) 10 MG tablet Take 10 mg by mouth 3 (three) times daily as needed for muscle spasms.  Marland Kitchen. docusate sodium (COLACE) 100 MG capsule Take 100 mg by mouth 2 (two) times daily as needed for mild constipation.  . metFORMIN (GLUCOPHAGE) 500 MG tablet Take 500 mg by mouth 2 (two) times daily with a meal.  . Misc Natural Products (OSTEO BI-FLEX JOINT SHIELD) TABS Take 1 tablet by mouth daily.  . ramipril (ALTACE) 5 MG capsule Take 5 mg by mouth every morning.  . simvastatin (ZOCOR) 20 MG tablet Take 20 mg by mouth every morning.      Allergies:   Nsaids   Social History   Tobacco Use  . Smoking status: Former Smoker    Years: 27.00    Types: Cigarettes    Quit date: 08/03/2001    Years since quitting: 17.1  . Smokeless tobacco: Never Used  Substance Use Topics  . Alcohol use: No  . Drug use: No     Family Hx: The patient's family history includes Diabetes in her brother, father, maternal grandfather, maternal grandmother, mother, paternal grandfather, paternal grandmother, and sister; Heart disease in her sister.  ROS:   Please see the history of present illness.     All other systems reviewed and are negative.   Prior CV studies:   The following studies were reviewed today:    Labs/Other Tests and Data Reviewed:    EKG:  No ECG reviewed.  Recent Labs: No results found for requested labs within last 8760 hours.   Recent Lipid Panel No results found for: CHOL, TRIG, HDL,  CHOLHDL, LDLCALC, LDLDIRECT  Wt Readings from Last 3 Encounters:  10/01/18 152 lb (68.9 kg)  01/18/16 160 lb (72.6 kg)  01/09/16 159 lb (72.1 kg)     Objective:    Vital Signs:  Ht 5\' 1"  (1.549 m)   Wt 152 lb (68.9 kg)   BMI 28.72 kg/m      ASSESSMENT & PLAN:    1. Hyperlipidemia:   Cont. Simvastatin.   Will get labs from Dr. Talmage NapBalan.   Continue  diet and exercise .  2.  Obesity:   Has lost 70 lbs .  Has been able to keep her weight off.  Tries to eat less than 15 grams of carbs per day   3.  Diabetes Mellitus:        COVID-19 Education: The signs and symptoms of COVID-19 were  discussed with the patient and how to seek care for testing (follow up with PCP or arrange E-visit).  The importance of social distancing was discussed today.  Time:   Today, I have spent  21 minutes with the patient with telehealth technology discussing the above problems.     Medication Adjustments/Labs and Tests Ordered: Current medicines are reviewed at length with the patient today.  Concerns regarding medicines are outlined above.   Tests Ordered: No orders of the defined types were placed in this encounter.   Medication Changes: No orders of the defined types were placed in this encounter.   Follow Up:  Virtual Visit or In Person in 1 year(s)  Signed, Mertie Moores, MD  10/01/2018 8:21 AM    [place]

## 2018-10-01 NOTE — Patient Instructions (Signed)
Medication Instructions:  Your provider recommends that you continue on your current medications as directed. Please refer to the Current Medication list given to you today.    Labwork: None  Testing/Procedures: None  Follow-Up: Your provider wants you to follow-up in: 1 year with Dr. Nahser. You will receive a reminder letter in the mail two months in advance. If you don't receive a letter, please call our office to schedule the follow-up appointment.    Any Other Special Instructions Will Be Listed Below (If Applicable).     If you need a refill on your cardiac medications before your next appointment, please call your pharmacy.   

## 2019-01-05 DIAGNOSIS — R809 Proteinuria, unspecified: Secondary | ICD-10-CM | POA: Diagnosis not present

## 2019-01-05 DIAGNOSIS — E7800 Pure hypercholesterolemia, unspecified: Secondary | ICD-10-CM | POA: Diagnosis not present

## 2019-01-05 DIAGNOSIS — G609 Hereditary and idiopathic neuropathy, unspecified: Secondary | ICD-10-CM | POA: Diagnosis not present

## 2019-01-05 DIAGNOSIS — E1165 Type 2 diabetes mellitus with hyperglycemia: Secondary | ICD-10-CM | POA: Diagnosis not present

## 2019-02-26 DIAGNOSIS — Z20828 Contact with and (suspected) exposure to other viral communicable diseases: Secondary | ICD-10-CM | POA: Diagnosis not present

## 2019-03-12 DIAGNOSIS — M2550 Pain in unspecified joint: Secondary | ICD-10-CM | POA: Diagnosis not present

## 2019-03-25 DIAGNOSIS — M150 Primary generalized (osteo)arthritis: Secondary | ICD-10-CM | POA: Diagnosis not present

## 2019-03-25 DIAGNOSIS — M5136 Other intervertebral disc degeneration, lumbar region: Secondary | ICD-10-CM | POA: Diagnosis not present

## 2019-05-20 DIAGNOSIS — E113512 Type 2 diabetes mellitus with proliferative diabetic retinopathy with macular edema, left eye: Secondary | ICD-10-CM | POA: Diagnosis not present

## 2019-05-20 DIAGNOSIS — H43812 Vitreous degeneration, left eye: Secondary | ICD-10-CM | POA: Diagnosis not present

## 2019-05-20 DIAGNOSIS — T85398S Other mechanical complication of other ocular prosthetic devices, implants and grafts, sequela: Secondary | ICD-10-CM | POA: Diagnosis not present

## 2019-05-20 DIAGNOSIS — H2513 Age-related nuclear cataract, bilateral: Secondary | ICD-10-CM | POA: Diagnosis not present

## 2019-06-03 DIAGNOSIS — E113512 Type 2 diabetes mellitus with proliferative diabetic retinopathy with macular edema, left eye: Secondary | ICD-10-CM | POA: Diagnosis not present

## 2019-06-10 DIAGNOSIS — E113512 Type 2 diabetes mellitus with proliferative diabetic retinopathy with macular edema, left eye: Secondary | ICD-10-CM | POA: Diagnosis not present

## 2019-07-07 DIAGNOSIS — G609 Hereditary and idiopathic neuropathy, unspecified: Secondary | ICD-10-CM | POA: Diagnosis not present

## 2019-07-07 DIAGNOSIS — E1165 Type 2 diabetes mellitus with hyperglycemia: Secondary | ICD-10-CM | POA: Diagnosis not present

## 2019-07-07 DIAGNOSIS — E559 Vitamin D deficiency, unspecified: Secondary | ICD-10-CM | POA: Diagnosis not present

## 2019-07-07 DIAGNOSIS — E7800 Pure hypercholesterolemia, unspecified: Secondary | ICD-10-CM | POA: Diagnosis not present

## 2019-07-13 DIAGNOSIS — I10 Essential (primary) hypertension: Secondary | ICD-10-CM | POA: Diagnosis not present

## 2019-07-13 DIAGNOSIS — R809 Proteinuria, unspecified: Secondary | ICD-10-CM | POA: Diagnosis not present

## 2019-07-13 DIAGNOSIS — G609 Hereditary and idiopathic neuropathy, unspecified: Secondary | ICD-10-CM | POA: Diagnosis not present

## 2019-07-13 DIAGNOSIS — E1165 Type 2 diabetes mellitus with hyperglycemia: Secondary | ICD-10-CM | POA: Diagnosis not present

## 2019-07-29 ENCOUNTER — Other Ambulatory Visit: Payer: Self-pay

## 2019-07-29 ENCOUNTER — Emergency Department (HOSPITAL_BASED_OUTPATIENT_CLINIC_OR_DEPARTMENT_OTHER)
Admission: EM | Admit: 2019-07-29 | Discharge: 2019-07-29 | Disposition: A | Discharge status: Home / Self Care | Payer: BC Managed Care – PPO | Attending: Emergency Medicine | Admitting: Emergency Medicine

## 2019-07-29 ENCOUNTER — Encounter (HOSPITAL_BASED_OUTPATIENT_CLINIC_OR_DEPARTMENT_OTHER): Payer: Self-pay | Admitting: Emergency Medicine

## 2019-07-29 DIAGNOSIS — M542 Cervicalgia: Secondary | ICD-10-CM | POA: Diagnosis not present

## 2019-07-29 DIAGNOSIS — E119 Type 2 diabetes mellitus without complications: Secondary | ICD-10-CM | POA: Insufficient documentation

## 2019-07-29 DIAGNOSIS — M5481 Occipital neuralgia: Secondary | ICD-10-CM | POA: Diagnosis not present

## 2019-07-29 DIAGNOSIS — Z87891 Personal history of nicotine dependence: Secondary | ICD-10-CM | POA: Insufficient documentation

## 2019-07-29 DIAGNOSIS — Z7984 Long term (current) use of oral hypoglycemic drugs: Secondary | ICD-10-CM | POA: Insufficient documentation

## 2019-07-29 MED ORDER — OXYCODONE-ACETAMINOPHEN 5-325 MG PO TABS
2.0000 | ORAL_TABLET | Freq: Once | ORAL | Status: AC
  Administered 2019-07-29: 2 via ORAL
  Filled 2019-07-29: qty 2

## 2019-07-29 MED ORDER — OXYCODONE-ACETAMINOPHEN 10-325 MG PO TABS: 1.0000 | ORAL_TABLET | Freq: Four times a day (QID) | ORAL | 0 refills | Status: AC | PRN

## 2019-07-29 NOTE — ED Provider Notes (Signed)
[facility] Provider Note: Lowella Dell, MD, FACEP  CSN: [id number] MRN: [id number] ARRIVAL: 07/29/19 at 2302 ROOM: MH01/MH01   CHIEF COMPLAINT  Neck Pain   HISTORY OF PRESENT ILLNESS  07/29/19 11:17 PM Lindsay [name] is a 59 y.o. female with 3 days of posterior neck pain.  She describes the pain as sharp and radiating upwards on both sides of the neck.  The pain includes her whole head.  She rates it as a 10 out of 10, worse with movement of her neck.  She has a history of cervical spinal fusion in 2016.  She denies any precipitating injury.  She is blind in the right eye.   Past Medical History:  Diagnosis Date  . Arthritis    hands,elbows,hips, back, shoulders  . Chronic constipation   . Depression   . Hiatal hernia   . Hyperlipidemia   . Left ureteral stone   . Type 2 diabetes, diet controlled (HCC)    hx iddm-- off insulin since gastric bypass 08/ 2017    Past Surgical History:  Procedure Laterality Date  . ANTERIOR FUSION CERVICAL SPINE  [facility]/2015   [facility] [place] [facility] Lovell Sheehan   C4 -- C6  . CARDIOVASCULAR STRESS TEST  07/18/2015   Low risk nuclear study w/ reverisible small defect of mild severity in apical lateral region, although likely represents breast attenuation artifact , cannnot rule out subtle area of ishemia in distal lateral wall/  normal LV function and wall motion , ef 64%  . CARPAL TUNNEL RELEASE Bilateral right 12/ 2017/  left 02/ 2017  . CYSTOSCOPY/URETEROSCOPY/HOLMIUM LASER/STENT PLACEMENT Left 01/18/2016   Procedure: CYSTOSCOPY/URETEROSCOPY/HOLMIUM LASER/STENT PLACEMENT;  Surgeon: Sebastian Ache, MD;  Location: [facility];  Service: Urology;  Laterality: Left;  . ESOPHAGEAL MANOMETRY N/A 06/27/2015   Procedure: ESOPHAGEAL MANOMETRY (EM);  Surgeon: Napoleon Form, MD;  Location: [facility];  Service: Endoscopy;  Laterality: N/A;  . EYE SURGERY Right 02/2015   "remove oil behind eye"  . HOLMIUM LASER  APPLICATION Left 01/18/2016   Procedure: HOLMIUM LASER APPLICATION;  Surgeon: Sebastian Ache, MD;  Location: [facility];  Service: Urology;  Laterality: Left;  . LAPAROSCOPIC ROUX-EN-Y GASTRIC BYPASS WITH HIATAL HERNIA REPAIR N/A 08/15/2015   Procedure: LAPAROSCOPIC ROUX-EN-Y GASTRIC BYPASS WITH HIATAL HERNIA REPAIR, UPPER ENDO;  Surgeon: De Blanch Kinsinger, MD;  Location: [facility];  Service: General;  Laterality: N/A;  . 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 Diabetes Mother   . Diabetes Father   . Heart disease Sister   . Diabetes Sister   . Diabetes Brother   . Diabetes Maternal Grandmother   . Diabetes Maternal Grandfather   . Diabetes Paternal Grandmother   . Diabetes Paternal Grandfather     Social History   Tobacco Use  . Smoking status: Former Smoker    Years: 27.00    Types: Cigarettes    Quit date: 08/03/2001    Years since quitting: 17.9  . Smokeless tobacco: Never Used  Substance Use Topics  . Alcohol use: No  . Drug use: No    Prior to Admission medications   Medication Sig Start Date End Date Taking? Authorizing Provider  Biotin 5000 MCG CAPS Take 1 capsule by mouth daily.    [provider]  cyclobenzaprine (FLEXERIL) 10 MG tablet Take 10 mg by  mouth 3 (three) times daily as needed for muscle spasms.    [provider]  docusate sodium (COLACE) 100 MG capsule Take 100 mg by mouth 2 (two) times daily as needed for mild constipation.    [provider]  metFORMIN (GLUCOPHAGE) 500 MG tablet Take 500 mg by mouth 2 (two) times daily with a meal.    [provider]  Misc Natural Products (OSTEO BI-FLEX JOINT SHIELD) TABS Take 1 tablet by mouth daily.    [provider]  oxyCODONE-acetaminophen (PERCOCET) 10-325 MG tablet Take 1 tablet by mouth every 6 (six) hours as needed for pain. 07/29/19   Alexios Keown,  MD  ramipril (ALTACE) 5 MG capsule Take 5 mg by mouth every morning.    [provider]  simvastatin (ZOCOR) 20 MG tablet Take 20 mg by mouth every morning.     [provider]    Allergies Nsaids   REVIEW OF SYSTEMS  Negative except as noted here or in the History of Present Illness.   PHYSICAL EXAMINATION  Initial Vital Signs Blood pressure (!) 138/56, pulse 56, temperature 98.3 F (36.8 C), temperature source Oral, resp. rate 16, weight 68.9 kg, SpO2 99 %.  Examination General: Well-developed, well-nourished female in no acute distress; appearance consistent with age of record HENT: normocephalic; atraumatic Eyes: Left pupil round and reactive to light; right pupil sluggish; extraocular muscles intact Neck: supple but range of motion limited due to pain; tenderness of bilateral posterior occipital regions Heart: regular rate and rhythm Lungs: clear to auscultation bilaterally Abdomen: soft; nondistended; nontender; bowel sounds present Extremities: No deformity; full range of motion Neurologic: Awake, alert and oriented; motor function intact in all extremities and symmetric; no facial droop Skin: Warm and dry Psychiatric: Normal mood and affect   RESULTS  Summary of this visit's results, reviewed and interpreted by myself:   EKG Interpretation  Date/Time:    Ventricular Rate:    PR Interval:    QRS Duration:   QT Interval:    QTC Calculation:   R Axis:     Text Interpretation:        Laboratory Studies: No results found for this or any previous visit (from the past 24 hour(s)). Imaging Studies: No results found.  ED COURSE and MDM  Nursing notes, initial and subsequent vitals signs, including pulse oximetry, reviewed and interpreted by myself.  Vitals:   07/29/19 2311 07/29/19 2312  BP: (!) 138/56   Pulse: 56   Resp: 16   Temp: 98.3 F (36.8 C)   TempSrc: Oral   SpO2: 99%   Weight:  68.9 kg   Medications   oxyCODONE-acetaminophen (PERCOCET/ROXICET) 5-325 MG per tablet 2 tablet (has no administration in time range)    The pattern of pain is consistent with bilateral occipital neuralgia.  We will treat her with a short course of narcotic pain medication and refer her to her surgeon, Dr. Lovell Sheehan.  PROCEDURES  Procedures   ED DIAGNOSES     ICD-10-CM   1. Bilateral occipital neuralgia  M54.81        Khamia Stambaugh, MD 07/29/19 2326

## 2019-07-29 NOTE — ED Triage Notes (Addendum)
Posterior neck pain since Sunday. Hx of cervical spine fusion in 2016. Pt also states headache since Sunday.

## 2019-07-31 DIAGNOSIS — I10 Essential (primary) hypertension: Secondary | ICD-10-CM | POA: Diagnosis not present

## 2019-07-31 DIAGNOSIS — M542 Cervicalgia: Secondary | ICD-10-CM | POA: Diagnosis not present

## 2019-07-31 DIAGNOSIS — Z6829 Body mass index (BMI) 29.0-29.9, adult: Secondary | ICD-10-CM | POA: Diagnosis not present

## 2019-09-09 DIAGNOSIS — H25011 Cortical age-related cataract, right eye: Secondary | ICD-10-CM | POA: Diagnosis not present

## 2019-09-09 DIAGNOSIS — E113512 Type 2 diabetes mellitus with proliferative diabetic retinopathy with macular edema, left eye: Secondary | ICD-10-CM | POA: Diagnosis not present

## 2019-09-09 DIAGNOSIS — T85398S Other mechanical complication of other ocular prosthetic devices, implants and grafts, sequela: Secondary | ICD-10-CM | POA: Diagnosis not present

## 2019-09-09 DIAGNOSIS — H43812 Vitreous degeneration, left eye: Secondary | ICD-10-CM | POA: Diagnosis not present

## 2019-10-14 DIAGNOSIS — E559 Vitamin D deficiency, unspecified: Secondary | ICD-10-CM | POA: Diagnosis not present

## 2021-08-04 ENCOUNTER — Ambulatory Visit: Payer: BC Managed Care – PPO | Admitting: Cardiovascular Disease

## 2021-08-04 ENCOUNTER — Encounter: Payer: Self-pay | Admitting: Cardiovascular Disease

## 2021-08-04 VITALS — BP 126/70 | HR 71 | Ht 61.0 in | Wt 162.2 lb

## 2021-08-04 DIAGNOSIS — R002 Palpitations: Secondary | ICD-10-CM

## 2021-08-04 DIAGNOSIS — E782 Mixed hyperlipidemia: Secondary | ICD-10-CM | POA: Diagnosis not present

## 2021-08-04 NOTE — Patient Instructions (Signed)
Medication Instructions:  Your physician recommends that you continue on your current medications as directed. Please refer to the Current Medication list given to you today.  *If you need a refill on your cardiac medications before your next appointment, please call your pharmacy*   Lab Work: NONE If you have labs (blood work) drawn today and your tests are completely normal, you will receive your results only by: MyChart Message (if you have MyChart) OR A paper copy in the mail If you have any lab test that is abnormal or we need to change your treatment, we will call you to review the results.   Testing/Procedures: NONE   Follow-Up: At [facility], you and your health needs are our priority.  As part of our continuing mission to provide you with exceptional heart care, we have created designated Provider Care Teams.  These Care Teams include your primary Cardiologist (physician) and Advanced Practice Providers (APPs -  Physician Assistants and Nurse Practitioners) who all work together to provide you with the care you need, when you need it.  We recommend signing up for the patient portal called "MyChart".  Sign up information is provided on this After Visit Summary.  MyChart is used to connect with patients for Virtual Visits (Telemedicine).  Patients are able to view lab/test results, encounter notes, upcoming appointments, etc.  Non-urgent messages can be sent to your provider as well.   To learn more about what you can do with MyChart, go to [email or web address].    Your next appointment:   1 year(s)  The format for your next appointment:   In Person  Provider:   Nahser, Swinyer, or Weaver {     Important Information About Sugar       

## 2021-08-04 NOTE — Progress Notes (Signed)
Cardiology Office Note:    Date:  08/04/2021   ID:  Lindsay [name], DOB Jul 26, 1960, MRN [id number]  PCP:  Lindsay Au, MD   [facility] Providers Cardiologist:  Pebbles Zeiders      Referring MD: Lindsay Au, MD   Chief Complaint  Patient presents with   Hyperlipidemia     History of Present Illness:  08/04/21   Lindsay [name] is a 61 y.o. female with a hx of hyperlipidemia, type 2 diabetes mellitus, arthritis  Wt is 162  Having some tingling in his legs  in her lower legs  No muscle weakness.  Has stopped working ,  has lots of arthritis in her knees  Has rare episodes of heart racing .  Might last for a few minutes . Might be weeks before it occurs again Follows keto diet.       Past Medical History:  Diagnosis Date   Arthritis    hands,elbows,hips, back, shoulders   Chronic constipation    Depression    Hiatal hernia    Hyperlipidemia    Left ureteral stone    Type 2 diabetes, diet controlled (HCC)    hx iddm-- off insulin since gastric bypass 08/ 2017    Past Surgical History:  Procedure Laterality Date   ANTERIOR FUSION CERVICAL SPINE  06/2015   [facility] [place] [facility] dr Lovell Sheehan   C4 -- C6   CARDIOVASCULAR STRESS TEST  07/18/2015   Low risk nuclear study w/ reverisible small defect of mild severity in apical lateral region, although likely represents breast attenuation artifact , cannnot rule out subtle area of ishemia in distal lateral wall/  normal LV function and wall motion , ef 64%   CARPAL TUNNEL RELEASE Bilateral right 12/ 2017/  left 02/ 2017   CYSTOSCOPY/URETEROSCOPY/HOLMIUM LASER/STENT PLACEMENT Left 01/18/2016   Procedure: CYSTOSCOPY/URETEROSCOPY/HOLMIUM LASER/STENT PLACEMENT;  Surgeon: Sebastian Ache, MD;  Location: [facility];  Service: Urology;  Laterality: Left;   ESOPHAGEAL MANOMETRY N/A 06/27/2015   Procedure: ESOPHAGEAL MANOMETRY (EM);  Surgeon: Napoleon Form, MD;  Location: [facility];   Service: Endoscopy;  Laterality: N/A;   EYE SURGERY Right 02/2015   "remove oil behind eye"   HOLMIUM LASER APPLICATION Left 01/18/2016   Procedure: HOLMIUM LASER APPLICATION;  Surgeon: Sebastian Ache, MD;  Location: [facility];  Service: Urology;  Laterality: Left;   LAPAROSCOPIC ROUX-EN-Y GASTRIC BYPASS WITH HIATAL HERNIA REPAIR N/A 08/15/2015   Procedure: LAPAROSCOPIC ROUX-EN-Y GASTRIC BYPASS WITH HIATAL HERNIA REPAIR, UPPER ENDO;  Surgeon: De Blanch Kinsinger, MD;  Location: [facility];  Service: General;  Laterality: N/A;   RETINAL DETACHMENT SURGERY Right 11/2014 and 03/ 2017   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   cyclobenzaprine (FLEXERIL) 10 MG tablet Take 10 mg by mouth 3 (three) times daily as needed for muscle spasms.   docusate sodium (COLACE) 100 MG capsule Take 100 mg by mouth 2 (two) times daily as needed for mild constipation.   metFORMIN (GLUCOPHAGE) 500 MG tablet Take 500 mg by mouth 2 (two) times daily with a meal.   Misc Natural Products (OSTEO BI-FLEX JOINT SHIELD) TABS Take 1 tablet by mouth daily.   oxyCODONE-acetaminophen (PERCOCET) 10-325 MG tablet Take 1 tablet by mouth every 6 (six) hours as needed for pain.   ramipril (ALTACE) 5 MG capsule Take 5 mg by mouth  every morning.   Semaglutide,0.25 or 0.5MG /DOS, (OZEMPIC, 0.25 OR 0.5 MG/DOSE,) 2 MG/3ML SOPN 0.5mg  once a week   simvastatin (ZOCOR) 20 MG tablet Take 20 mg by mouth every morning.    Vitamin D, Ergocalciferol, (DRISDOL) 1.25 MG (50000 UNIT) CAPS capsule Take 1 capsule by mouth once a week.     Allergies:   Nsaids   Social History   Socioeconomic History   Marital status: Married    Spouse name: Not on file   Number of children: Not on file   Years of education: Not on file   Highest education level: Not on file  Occupational History   Not on file  Tobacco Use   Smoking status:  Former    Years: 27.00    Types: Cigarettes    Quit date: 08/03/2001    Years since quitting: 20.0   Smokeless tobacco: Never  Substance and Sexual Activity   Alcohol use: No   Drug use: No   Sexual activity: Not on file  Other Topics Concern   Not on file  Social History Narrative   Not on file   Social Determinants of Health   Financial Resource Strain: Not on file  Food Insecurity: Not on file  Transportation Needs: Not on file  Physical Activity: Not on file  Stress: Not on file  Social Connections: Not on file     Family History: The patient's family history includes Diabetes in her brother, father, maternal grandfather, maternal grandmother, mother, paternal grandfather, paternal grandmother, and sister; Heart disease in her sister.  ROS:   Please see the history of present illness.     All other systems reviewed and are negative.  EKGs/Labs/Other Studies Reviewed:    The following studies were reviewed today:   EKG: August 04, 2021: Normal sinus rhythm at 71.  No ST or T wave changes.  Recent Labs: No results found for requested labs within last 365 days.  Recent Lipid Panel No results found for: "CHOL", "TRIG", "HDL", "CHOLHDL", "VLDL", "LDLCALC", "LDLDIRECT"   Risk Assessment/Calculations:           Physical Exam:    VS:  BP 126/70   Pulse 71   Ht 5\' 1"  (1.549 m)   Wt 162 lb 3.2 oz (73.6 kg)   SpO2 98%   BMI 30.65 kg/m     Wt Readings from Last 3 Encounters:  08/04/21 162 lb 3.2 oz (73.6 kg)  07/29/19 151 lb 14.4 oz (68.9 kg)  10/01/18 152 lb (68.9 kg)    GEN:  Well nourished, well developed in no acute distress HEENT: Normal NECK: No JVD; No carotid bruits LYMPHATICS: No lymphadenopathy CARDIAC: RRR, no murmurs, rubs, gallops RESPIRATORY:  Clear to auscultation without rales, wheezing or rhonchi  ABDOMEN: Soft, non-tender, non-distended MUSCULOSKELETAL:  No edema; No deformity  SKIN: Warm and dry NEUROLOGIC:  Alert and oriented x  3 PSYCHIATRIC:  Normal affect   ASSESSMENT:    1. Palpitations   2. Mixed hyperlipidemia    PLAN:    In order of problems listed above:  Hyperlipidemia: 10/03/18 presents for further evaluation of her hyperlipidemia.  Last LDL 63.  This has been most recently managed by her endocrinologist.  Continue current medications.  I advised her to work on improving diet and exercise.  2.  Tingling in her feet.  Likely due to neuropathy , does not appear to be a vascular issue            Medication Adjustments/Labs and  Tests Ordered: Current medicines are reviewed at length with the patient today.  Concerns regarding medicines are outlined above.  Orders Placed This Encounter  Procedures   EKG 12-Lead   No orders of the defined types were placed in this encounter.   Patient Instructions  Medication Instructions:  Your physician recommends that you continue on your current medications as directed. Please refer to the Current Medication list given to you today.  *If you need a refill on your cardiac medications before your next appointment, please call your pharmacy*   Lab Work: NONE If you have labs (blood work) drawn today and your tests are completely normal, you will receive your results only by: MyChart Message (if you have MyChart) OR A paper copy in the mail If you have any lab test that is abnormal or we need to change your treatment, we will call you to review the results.   Testing/Procedures: NONE   Follow-Up: At [facility], you and your health needs are our priority.  As part of our continuing mission to provide you with exceptional heart care, we have created designated Provider Care Teams.  These Care Teams include your primary Cardiologist (physician) and Advanced Practice Providers (APPs -  Physician Assistants and Nurse Practitioners) who all work together to provide you with the care you need, when you need it.  We recommend signing up for the patient portal  called "MyChart".  Sign up information is provided on this After Visit Summary.  MyChart is used to connect with patients for Virtual Visits (Telemedicine).  Patients are able to view lab/test results, encounter notes, upcoming appointments, etc.  Non-urgent messages can be sent to your provider as well.   To learn more about what you can do with MyChart, go to [name].    Your next appointment:   1 year(s)  The format for your next appointment:   In Person  Provider:   Louann Sjogren, or [employer] {     Important Information About Sugar         Signed, Kristeen Miss, MD  08/04/2021 2:03 PM    [facility]

## 2022-09-26 ENCOUNTER — Ambulatory Visit: Payer: BC Managed Care – PPO | Admitting: Emergency Medicine

## 2022-10-08 ENCOUNTER — Encounter: Payer: Self-pay | Admitting: Emergency Medicine

## 2022-10-08 ENCOUNTER — Ambulatory Visit: Payer: BC Managed Care – PPO | Admitting: Emergency Medicine

## 2022-10-08 VITALS — BP 136/74 | HR 69 | Temp 98.2°F | Ht 61.0 in | Wt 167.1 lb

## 2022-10-08 DIAGNOSIS — E785 Hyperlipidemia, unspecified: Secondary | ICD-10-CM

## 2022-10-08 DIAGNOSIS — Z7689 Persons encountering health services in other specified circumstances: Secondary | ICD-10-CM

## 2022-10-08 DIAGNOSIS — E1169 Type 2 diabetes mellitus with other specified complication: Secondary | ICD-10-CM

## 2022-10-08 DIAGNOSIS — F5104 Psychophysiologic insomnia: Secondary | ICD-10-CM

## 2022-10-08 DIAGNOSIS — Z9889 Other specified postprocedural states: Secondary | ICD-10-CM | POA: Diagnosis not present

## 2022-10-08 DIAGNOSIS — M2550 Pain in unspecified joint: Secondary | ICD-10-CM | POA: Diagnosis not present

## 2022-10-08 DIAGNOSIS — Z7985 Long-term (current) use of injectable non-insulin antidiabetic drugs: Secondary | ICD-10-CM

## 2022-10-08 DIAGNOSIS — G894 Chronic pain syndrome: Secondary | ICD-10-CM

## 2022-10-08 DIAGNOSIS — Z7984 Long term (current) use of oral hypoglycemic drugs: Secondary | ICD-10-CM

## 2022-10-08 DIAGNOSIS — M1189 Other specified crystal arthropathies, multiple sites: Secondary | ICD-10-CM

## 2022-10-08 MED ORDER — ZOLPIDEM TARTRATE 5 MG PO TABS: 5.0000 mg | ORAL_TABLET | Freq: Every evening | ORAL | 1 refills | Status: AC | PRN

## 2022-10-08 NOTE — Patient Instructions (Signed)

## 2022-10-08 NOTE — Progress Notes (Signed)
Lindsay [name] 62 y.o.   Chief Complaint  Patient presents with   New Patient (Initial Visit)    No concerns     HISTORY OF PRESENT ILLNESS: This is a 62 y.o. female A1A first visit to this office, here to establish care with me.  Husband is a patient of mine. [name] has history of dyslipidemia and diabetes.  Sees endocrinologist on a regular basis.  Has been on Ozempic for 2 years.  Also taking metformin.  Sugar has been well-controlled.  No concerns. History of gastric bypass surgery in 2017.  Doing well Sees rheumatologist on a regular basis due to polyarthralgia secondary to pseudogout.  Has follow-up appointment tomorrow.  However has chronic daily multiple joint pains affecting quality of life.  Pain not well-controlled. Also complaining of lack of sleep making pain worse.  Inquiring about sleeping medication.  HPI   Prior to Admission medications   Medication Sig Start Date End Date Taking? Authorizing Provider  Biotin 5000 MCG CAPS Take 1 capsule by mouth daily.   Yes [provider]  COLCHICINE PO Take by mouth.   Yes [provider]  cyclobenzaprine (FLEXERIL) 10 MG tablet Take 10 mg by mouth 3 (three) times daily as needed for muscle spasms.   Yes [provider]  docusate sodium (COLACE) 100 MG capsule Take 100 mg by mouth 2 (two) times daily as needed for mild constipation.   Yes [provider]  metFORMIN (GLUCOPHAGE) 500 MG tablet Take 500 mg by mouth 2 (two) times daily with a meal.   Yes [provider]  Misc Natural Products (OSTEO BI-FLEX JOINT SHIELD) TABS Take 1 tablet by mouth daily.   Yes [provider]  predniSONE (DELTASONE) 10 MG tablet Take 10 mg by mouth daily with breakfast.   Yes [provider]  ramipril (ALTACE) 5 MG capsule Take 5 mg by mouth every morning.   Yes [provider]  Semaglutide,0.25 or 0.5MG /DOS, (OZEMPIC, 0.25 OR 0.5 MG/DOSE,) 2 MG/3ML SOPN 0.5mg  once a week 12/29/20   Yes [provider]  simvastatin (ZOCOR) 20 MG tablet Take 20 mg by mouth every morning.    Yes [provider]  Vitamin D, Ergocalciferol, (DRISDOL) 1.25 MG (50000 UNIT) CAPS capsule Take 1 capsule by mouth once a week.   Yes [provider]  oxyCODONE-acetaminophen (PERCOCET) 10-325 MG tablet Take 1 tablet by mouth every 6 (six) hours as needed for pain. Patient not taking: Reported on 10/08/2022 07/29/19   Molpus, Jonny Ruiz, MD    Allergies  Allergen Reactions   Nsaids Other (See Comments)    Avoids , hx ulcer and gastric bypass    Patient Active Problem List   Diagnosis Date Noted   Morbid obesity (HCC) 11/18/2014   Diabetes mellitus without complication (HCC)    Hyperlipemia     Past Medical History:  Diagnosis Date   Arthritis    hands,elbows,hips, back, shoulders   Chronic constipation    Depression    Hiatal hernia    Hyperlipidemia    Left ureteral stone    Type 2 diabetes, diet controlled (HCC)    hx iddm-- off insulin since gastric bypass 08/ 2017    Past Surgical History:  Procedure Laterality Date   ANTERIOR FUSION CERVICAL SPINE  [facility]   [facility] [place] [facility] Lovell Sheehan   C4 -- C6   CARDIOVASCULAR STRESS TEST  07/18/2015   Low risk nuclear study w/ reverisible small defect of mild severity in  apical lateral region, although likely represents breast attenuation artifact , cannnot rule out subtle area of ishemia in distal lateral wall/  normal LV function and wall motion , ef 64%   CARPAL TUNNEL RELEASE Bilateral right 12/ 2017/  left 02/ 2017   CYSTOSCOPY/URETEROSCOPY/HOLMIUM LASER/STENT PLACEMENT Left 01/18/2016   Procedure: CYSTOSCOPY/URETEROSCOPY/HOLMIUM LASER/STENT PLACEMENT;  Surgeon: Sebastian Ache, MD;  Location: [facility];  Service: Urology;  Laterality: Left;   ESOPHAGEAL MANOMETRY N/A 06/27/2015   Procedure: ESOPHAGEAL MANOMETRY (EM);  Surgeon: Napoleon Form, MD;  Location: [facility];  Service:  Endoscopy;  Laterality: N/A;   EYE SURGERY Right 02/2015   "remove oil behind eye"   HOLMIUM LASER APPLICATION Left 01/18/2016   Procedure: HOLMIUM LASER APPLICATION;  Surgeon: Sebastian Ache, MD;  Location: [facility];  Service: Urology;  Laterality: Left;   LAPAROSCOPIC ROUX-EN-Y GASTRIC BYPASS WITH HIATAL HERNIA REPAIR N/A 08/15/2015   Procedure: LAPAROSCOPIC ROUX-EN-Y GASTRIC BYPASS WITH HIATAL HERNIA REPAIR, UPPER ENDO;  Surgeon: De Blanch Kinsinger, MD;  Location: [facility];  Service: General;  Laterality: N/A;   RETINAL DETACHMENT SURGERY Right 11/2014 and 03/ 2017   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    Social History   Socioeconomic History   Marital status: Married    Spouse name: Not on file   Number of children: Not on file   Years of education: Not on file   Highest education level: Not on file  Occupational History   Not on file  Tobacco Use   Smoking status: Former    Current packs/day: 0.00    Types: Cigarettes    Start date: 08/04/1974    Quit date: 08/03/2001    Years since quitting: 21.1   Smokeless tobacco: Never  Substance and Sexual Activity   Alcohol use: No   Drug use: No   Sexual activity: Not on file  Other Topics Concern   Not on file  Social History Narrative   Not on file   Social Determinants of Health   Financial Resource Strain: Not on file  Food Insecurity: Not on file  Transportation Needs: Not on file  Physical Activity: Not on file  Stress: Not on file  Social Connections: Not on file  Intimate Partner Violence: Not on file    Family History  Problem Relation Age of Onset   Diabetes Mother    Diabetes Father    Heart disease Sister    Diabetes Sister    Diabetes Brother    Diabetes Maternal Grandmother    Diabetes Maternal Grandfather    Diabetes Paternal Grandmother    Diabetes Paternal Grandfather      Review of Systems  Constitutional: Negative.  Negative for chills and fever.  HENT: Negative.  Negative  for congestion and sore throat.   Respiratory: Negative.  Negative for cough and shortness of breath.   Cardiovascular: Negative.  Negative for chest pain and palpitations.  Gastrointestinal:  Negative for abdominal pain, diarrhea, nausea and vomiting.  Genitourinary: Negative.  Negative for dysuria and hematuria.  Musculoskeletal:  Positive for joint pain.  Skin: Negative.  Negative for rash.  Neurological: Negative.  Negative for dizziness and headaches.    Vitals:   10/08/22 0846  BP: 136/74  Pulse: 69  Temp: 98.2 F (36.8 C)  SpO2: 97%    Physical Exam Vitals reviewed.  Constitutional:      Appearance: Normal appearance.  HENT:     Head: Normocephalic.     Mouth/Throat:  Mouth: Mucous membranes are moist.     Pharynx: Oropharynx is clear.  Eyes:     Extraocular Movements: Extraocular movements intact.     Conjunctiva/sclera: Conjunctivae normal.     Pupils: Pupils are equal, round, and reactive to light.  Cardiovascular:     Rate and Rhythm: Normal rate and regular rhythm.     Pulses: Normal pulses.     Heart sounds: Normal heart sounds.  Pulmonary:     Effort: Pulmonary effort is normal.     Breath sounds: Normal breath sounds.  Musculoskeletal:        General: No deformity.     Cervical back: No tenderness.     Right lower leg: No edema.     Left lower leg: No edema.  Lymphadenopathy:     Cervical: No cervical adenopathy.  Skin:    General: Skin is warm and dry.     Capillary Refill: Capillary refill takes less than 2 seconds.  Neurological:     General: No focal deficit present.     Mental Status: She is alert and oriented to person, place, and time.  Psychiatric:        Mood and Affect: Mood normal.        Behavior: Behavior normal.      ASSESSMENT & PLAN: A total of 48 minutes was spent with the patient and counseling/coordination of care regarding preparing for this visit, review of available medical records, establishing care with me,  comprehensive history and physical examination, review of multiple chronic medical conditions under management, review of all medications, cardiovascular risks associated with diabetes and dyslipidemia, chronic pain management and need for pain management clinic evaluation, chronic insomnia management, prognosis, documentation, and need for follow-up.  Problem List Items Addressed This Visit       Endocrine   Dyslipidemia associated with type 2 diabetes mellitus (HCC) - Primary    Well-controlled diabetes Sees endocrinologist on a regular basis Continues weekly Ozempic 0.5 mg on daily metformin 500 mg twice a day Also taking ramipril 5 mg daily and simvastatin 20 mg daily Diet and nutrition discussed Cardiovascular risks associated with diabetes and dyslipidemia discussed        Musculoskeletal and Integument   Pseudogout involving multiple joints    Currently under control Occasional flareups Sees rheumatologist on a regular basis.  Has appointment for tomorrow      Relevant Medications   predniSONE (DELTASONE) 10 MG tablet   COLCHICINE PO     Other   Chronic pain syndrome    Active and affecting quality of life Daily pain.  Recommend evaluation by pain management clinic.  Referral placed today.      Relevant Medications   predniSONE (DELTASONE) 10 MG tablet   Other Relevant Orders   Ambulatory referral to Pain Clinic   Arthralgia of multiple sites    Active and affecting quality of life Has follow-up with rheumatologist tomorrow Advised to discuss management options including the use of monoclonal antibodies.      Relevant Orders   Ambulatory referral to Pain Clinic   History of gastric surgery    Stable.  Diet and nutrition discussed.  Advised to avoid NSAIDs      Chronic insomnia    Active and affecting quality of life and exacerbating chronic pain Sleep hygiene measures discussed Recommend Ambien 5 mg at bedtime as needed      Relevant Medications    zolpidem (AMBIEN) 5 MG tablet   Other Visit Diagnoses  Encounter to establish care          Patient Instructions  Health Maintenance, Female Adopting a healthy lifestyle and getting preventive care are important in promoting health and wellness. Ask your health care provider about: The right schedule for you to have regular tests and exams. Things you can do on your own to prevent diseases and keep yourself healthy. What should I know about diet, weight, and exercise? Eat a healthy diet  Eat a diet that includes plenty of vegetables, fruits, low-fat dairy products, and lean protein. Do not eat a lot of foods that are high in solid fats, added sugars, or sodium. Maintain a healthy weight Body mass index (BMI) is used to identify weight problems. It estimates body fat based on height and weight. Your health care provider can help determine your BMI and help you achieve or maintain a healthy weight. Get regular exercise Get regular exercise. This is one of the most important things you can do for your health. Most adults should: Exercise for at least 150 minutes each week. The exercise should increase your heart rate and make you sweat (moderate-intensity exercise). Do strengthening exercises at least twice a week. This is in addition to the moderate-intensity exercise. Spend less time sitting. Even light physical activity can be beneficial. Watch cholesterol and blood lipids Have your blood tested for lipids and cholesterol at 61 years of age, then have this test every 5 years. Have your cholesterol levels checked more often if: Your lipid or cholesterol levels are high. You are older than 61 years of age. You are at high risk for heart disease. What should I know about cancer screening? Depending on your health history and family history, you may need to have cancer screening at various ages. This may include screening for: Breast cancer. Cervical cancer. Colorectal cancer. Skin  cancer. Lung cancer. What should I know about heart disease, diabetes, and high blood pressure? Blood pressure and heart disease High blood pressure causes heart disease and increases the risk of stroke. This is more likely to develop in people who have high blood pressure readings or are overweight. Have your blood pressure checked: Every 3-5 years if you are 57-90 years of age. Every year if you are 32 years old or older. Diabetes Have regular diabetes screenings. This checks your fasting blood sugar level. Have the screening done: Once every three years after age 52 if you are at a normal weight and have a low risk for diabetes. More often and at a younger age if you are overweight or have a high risk for diabetes. What should I know about preventing infection? Hepatitis B If you have a higher risk for hepatitis B, you should be screened for this virus. Talk with your health care provider to find out if you are at risk for hepatitis B infection. Hepatitis C Testing is recommended for: Everyone born from [birth year] through 1964. Anyone with known risk factors for hepatitis C. Sexually transmitted infections (STIs) Get screened for STIs, including gonorrhea and chlamydia, if: You are sexually active and are younger than 61 years of age. You are older than 61 years of age and your health care provider tells you that you are at risk for this type of infection. Your sexual activity has changed since you were last screened, and you are at increased risk for chlamydia or gonorrhea. Ask your health care provider if you are at risk. Ask your health care provider about whether you are at high  risk for HIV. Your health care provider may recommend a prescription medicine to help prevent HIV infection. If you choose to take medicine to prevent HIV, you should first get tested for HIV. You should then be tested every 3 months for as long as you are taking the medicine. Pregnancy If you are about to stop  having your period (premenopausal) and you may become pregnant, seek counseling before you get pregnant. Take 400 to 800 micrograms (mcg) of folic acid every day if you become pregnant. Ask for birth control (contraception) if you want to prevent pregnancy. Osteoporosis and menopause Osteoporosis is a disease in which the bones lose minerals and strength with aging. This can result in bone fractures. If you are 72 years old or older, or if you are at risk for osteoporosis and fractures, ask your health care provider if you should: Be screened for bone loss. Take a calcium or vitamin D supplement to lower your risk of fractures. Be given hormone replacement therapy (HRT) to treat symptoms of menopause. Follow these instructions at home: Alcohol use Do not drink alcohol if: Your health care provider tells you not to drink. You are pregnant, may be pregnant, or are planning to become pregnant. If you drink alcohol: Limit how much you have to: 0-1 drink a day. Know how much alcohol is in your drink. In the [place] one drink equals one 12 oz bottle of beer (355 mL), one 5 oz glass of wine (148 mL), or one 1 oz glass of hard liquor (44 mL). Lifestyle Do not use any products that contain nicotine or tobacco. These products include cigarettes, chewing tobacco, and vaping devices, such as e-cigarettes. If you need help quitting, ask your health care provider. Do not use street drugs. Do not share needles. Ask your health care provider for help if you need support or information about quitting drugs. General instructions Schedule regular health, dental, and eye exams. Stay current with your vaccines. Tell your health care provider if: You often feel depressed. You have ever been abused or do not feel safe at home. Summary Adopting a healthy lifestyle and getting preventive care are important in promoting health and wellness. Follow your health care provider's instructions about healthy diet,  exercising, and getting tested or screened for diseases. Follow your health care provider's instructions on monitoring your cholesterol and blood pressure. This information is not intended to replace advice given to you by your health care provider. Make sure you discuss any questions you have with your health care provider. Document Revised: 05/09/2020 Document Reviewed: 05/09/2020 Elsevier Patient Education  2024 Elsevier Inc.     Edwina Barth, MD [facility]

## 2022-10-08 NOTE — Assessment & Plan Note (Signed)
Active and affecting quality of life Has follow-up with rheumatologist tomorrow Advised to discuss management options including the use of monoclonal antibodies.

## 2022-10-08 NOTE — Assessment & Plan Note (Signed)
Stable.  Diet and nutrition discussed.  Advised to avoid NSAIDs

## 2022-10-08 NOTE — Assessment & Plan Note (Addendum)
Active and affecting quality of life and exacerbating chronic pain Sleep hygiene measures discussed Recommend Ambien 5 mg at bedtime as needed

## 2022-10-08 NOTE — Assessment & Plan Note (Signed)
Active and affecting quality of life Daily pain.  Recommend evaluation by pain management clinic.  Referral placed today.

## 2022-10-08 NOTE — Assessment & Plan Note (Signed)
Well-controlled diabetes Sees endocrinologist on a regular basis Continues weekly Ozempic 0.5 mg on daily metformin 500 mg twice a day Also taking ramipril 5 mg daily and simvastatin 20 mg daily Diet and nutrition discussed Cardiovascular risks associated with diabetes and dyslipidemia discussed

## 2022-10-08 NOTE — Assessment & Plan Note (Signed)
Currently under control Occasional flareups Sees rheumatologist on a regular basis.  Has appointment for tomorrow

## 2022-10-16 LAB — HM MAMMOGRAPHY

## 2022-10-17 ENCOUNTER — Encounter: Payer: Self-pay | Admitting: Emergency Medicine

## 2023-04-08 ENCOUNTER — Ambulatory Visit: Payer: Self-pay | Admitting: Emergency Medicine

## 2023-07-14 NOTE — Progress Notes (Unsigned)
 No chief complaint on file.  History of Present Illness: 63 yo female with history of HLD, hiatal hernia, DM2 here today for follow up. She has been followed by Dr. Alveta. Her DM and HLD are also followed in the Endocrinology clinic. Normal stress test in 2017.   She is here today for follow up. The patient denies any chest pain, dyspnea, palpitations, lower extremity edema, orthopnea, PND, dizziness, near syncope or syncope.   Primary Care Physician: Purcell Emil Schanz, MD   Past Medical History:  Diagnosis Date   Arthritis    hands,elbows,hips, back, shoulders   Chronic constipation    Depression    Hiatal hernia    Hyperlipidemia    Left ureteral stone    Type 2 diabetes, diet controlled (HCC)    hx iddm-- off insulin  since gastric bypass 08/ 2017    Past Surgical History:  Procedure Laterality Date   ANTERIOR FUSION CERVICAL SPINE  06/2015   [facility] [place] [facility] dr mavis   C4 -- C6   CARDIOVASCULAR STRESS TEST  07/18/2015   Low risk nuclear study w/ reverisible small defect of mild severity in apical lateral region, although likely represents breast attenuation artifact , cannnot rule out subtle area of ishemia in distal lateral wall/  normal LV function and wall motion , ef 64%   CARPAL TUNNEL RELEASE Bilateral right 12/ 2017/  left 02/ 2017   CYSTOSCOPY/URETEROSCOPY/HOLMIUM LASER/STENT PLACEMENT Left 01/18/2016   Procedure: CYSTOSCOPY/URETEROSCOPY/HOLMIUM LASER/STENT PLACEMENT;  Surgeon: Ricardo Likens, MD;  Location: [facility];  Service: Urology;  Laterality: Left;   ESOPHAGEAL MANOMETRY N/A 06/27/2015   Procedure: ESOPHAGEAL MANOMETRY (EM);  Surgeon: Gustav LULLA Mcgee, MD;  Location: [facility];  Service: Endoscopy;  Laterality: N/A;   EYE SURGERY Right 02/2015   remove oil behind eye   HOLMIUM LASER APPLICATION Left 01/18/2016   Procedure: HOLMIUM LASER APPLICATION;  Surgeon: Ricardo Likens, MD;  [facility];   Service: Urology;  Laterality: Left;   LAPAROSCOPIC ROUX-EN-Y GASTRIC BYPASS WITH HIATAL HERNIA REPAIR N/A 08/15/2015   Procedure: LAPAROSCOPIC ROUX-EN-Y GASTRIC BYPASS WITH HIATAL HERNIA REPAIR, UPPER ENDO;  Surgeon: Herlene Righter Kinsinger, MD;  Location: [facility];  Service: General;  Laterality: N/A;   RETINAL DETACHMENT SURGERY Right 11/2014 and 03/ 2017   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     COLCHICINE PO Take by mouth.     cyclobenzaprine (FLEXERIL) 10 MG tablet Take 10 mg by mouth 3 (three) times daily as needed for muscle spasms.     docusate sodium (COLACE) 100 MG capsule Take 100 mg by mouth 2 (two) times daily as needed for mild constipation.     metFORMIN (GLUCOPHAGE) 500 MG tablet Take 500 mg by mouth 2 (two) times daily with a meal.     Misc Natural Products (OSTEO BI-FLEX JOINT SHIELD) TABS Take 1 tablet by mouth daily.     oxyCODONE -acetaminophen  (PERCOCET) 10-325 MG tablet Take 1 tablet by mouth every 6 (six) hours as needed for pain. (Patient not taking: Reported on 10/08/2022) 20 tablet 0   predniSONE (DELTASONE) 10 MG tablet Take 10 mg by mouth daily with breakfast.     ramipril (ALTACE) 5 MG capsule Take 5 mg by mouth every morning.     Semaglutide,0.25 or 0.5MG /DOS, (OZEMPIC, 0.25 OR 0.5 MG/DOSE,) 2 MG/3ML SOPN  0.5mg  once a week     simvastatin (ZOCOR) 20 MG tablet Take 20 mg by mouth every morning.      Vitamin D, Ergocalciferol, (DRISDOL) 1.25 MG (50000 UNIT) CAPS capsule Take 1 capsule by mouth once a week.     zolpidem  (AMBIEN ) 5 MG tablet Take 1 tablet (5 mg total) by mouth at bedtime as needed for sleep. 15 tablet 1   No current facility-administered medications for this visit.    Allergies  Allergen Reactions   Nsaids Other (See Comments)    Avoids , hx ulcer and gastric bypass    Social History   Socioeconomic History   Marital status:  Married    Spouse name: Not on file   Number of children: Not on file   Years of education: Not on file   Highest education level: Not on file  Occupational History   Not on file  Tobacco Use   Smoking status: Former    Current packs/day: 0.00    Types: Cigarettes    Start date: 08/04/1974    Quit date: 08/03/2001    Years since quitting: 21.9   Smokeless tobacco: Never  Substance and Sexual Activity   Alcohol use: No   Drug use: No   Sexual activity: Not on file  Other Topics Concern   Not on file  Social History Narrative   Not on file   Social Drivers of Health   Financial Resource Strain: Not on file  Food Insecurity: Not on file  Transportation Needs: Not on file  Physical Activity: Not on file  Stress: Not on file  Social Connections: Not on file  Intimate Partner Violence: Not on file    Family History  Problem Relation Age of Onset   Diabetes Mother    Diabetes Father    Heart disease Sister    Diabetes Sister    Diabetes Brother    Diabetes Maternal Grandmother    Diabetes Maternal Grandfather    Diabetes Paternal Grandmother    Diabetes Paternal Grandfather     Review of Systems:  As stated in the HPI and otherwise negative.   There were no vitals taken for this visit.  Physical Examination: General: Well developed, well nourished, NAD  HEENT: OP clear, mucus membranes moist  SKIN: warm, dry. No rashes. Neuro: No focal deficits  Musculoskeletal: Muscle strength 5/5 all ext  Psychiatric: Mood and affect normal  Neck: No JVD, no carotid bruits, no thyromegaly, no lymphadenopathy.  Lungs:Clear bilaterally, no wheezes, rhonci, crackles Cardiovascular: Regular rate and rhythm. No murmurs, gallops or rubs. Abdomen:Soft. Bowel sounds present. Non-tender.  Extremities: No lower extremity edema. Pulses are 2 + in the bilateral DP/PT.  EKG:  EKG {ACTION; IS/IS WNU:[id number]} ordered today. The ekg ordered today demonstrates ***  Recent Labs: No results  found for requested labs within last 365 days.   Lipid Panel No results found for: CHOL, TRIG, HDL, CHOLHDL, VLDL, LDLCALC, LDLDIRECT   Wt Readings from Last 3 Encounters:  10/08/22 167 lb 2 oz (75.8 kg)  08/04/21 162 lb 3.2 oz (73.6 kg)  07/29/19 151 lb 14.4 oz (68.9 kg)      Assessment and Plan:   1. Hyperlipidemia: LDL ***. Continue statin  Labs/ tests ordered today include:  No orders of the defined types were placed in this encounter.    Disposition:   F/U with me in ***    Signed, Lonni Cash, MD, [facility] 07/14/2023 10:51 AM    [facility] [address] [place], [place], [place]  [address] Phone: [phone]; Fax: [phone]

## 2023-07-15 ENCOUNTER — Encounter: Payer: Self-pay | Admitting: Cardiovascular Disease

## 2023-07-15 ENCOUNTER — Ambulatory Visit: Attending: Cardiovascular Disease | Admitting: Cardiovascular Disease

## 2023-07-15 VITALS — BP 110/56 | HR 9 | Ht 61.0 in | Wt 170.8 lb

## 2023-07-15 DIAGNOSIS — E782 Mixed hyperlipidemia: Secondary | ICD-10-CM | POA: Insufficient documentation

## 2023-07-15 DIAGNOSIS — R002 Palpitations: Secondary | ICD-10-CM | POA: Insufficient documentation

## 2023-07-15 NOTE — Patient Instructions (Addendum)
 Medication Instructions:  No changes *If you need a refill on your cardiac medications before your next appointment, please call your pharmacy*  Follow-Up: At [facility], you and your health needs are our priority.  As part of our continuing mission to provide you with exceptional heart care, our providers are all part of one team.  This team includes your primary Cardiologist (physician) and Advanced Practice Providers or APPs (Physician Assistants and Nurse Practitioners) who all work together to provide you with the care you need, when you need it.  Your next appointment:   2 year(s)  Provider:   Lonni Cash, MD    We recommend signing up for the patient portal called MyChart.  Sign up information is provided on this After Visit Summary.  MyChart is used to connect with patients for Virtual Visits (Telemedicine).  Patients are able to view lab/test results, encounter notes, upcoming appointments, etc.  Non-urgent messages can be sent to your provider as well.   To learn more about what you can do with MyChart, go to [email or web address].

## 2023-10-02 ENCOUNTER — Ambulatory Visit: Admitting: Emergency Medicine

## 2023-10-22 LAB — HM MAMMOGRAPHY

## 2023-10-25 ENCOUNTER — Encounter: Payer: Self-pay | Admitting: Emergency Medicine

## 2023-10-25 ENCOUNTER — Ambulatory Visit: Payer: Self-pay | Admitting: Emergency Medicine

## 2023-11-01 ENCOUNTER — Encounter: Payer: Self-pay | Admitting: Emergency Medicine
